# Patient Record
Sex: Male | Born: 1945 | Race: White | Hispanic: No | Marital: Married | Smoking: Never smoker
Health system: Southern US, Community
[De-identification: ages and names within clinical notes are randomized; demographics above are authoritative.]

## PROBLEM LIST (undated history)

## (undated) DIAGNOSIS — K219 Gastro-esophageal reflux disease without esophagitis: Secondary | ICD-10-CM

## (undated) DIAGNOSIS — Z972 Presence of dental prosthetic device (complete) (partial): Secondary | ICD-10-CM

## (undated) DIAGNOSIS — N529 Male erectile dysfunction, unspecified: Secondary | ICD-10-CM

## (undated) DIAGNOSIS — R399 Unspecified symptoms and signs involving the genitourinary system: Secondary | ICD-10-CM

## (undated) DIAGNOSIS — I719 Aortic aneurysm of unspecified site, without rupture: Secondary | ICD-10-CM

## (undated) DIAGNOSIS — C61 Malignant neoplasm of prostate: Secondary | ICD-10-CM

## (undated) DIAGNOSIS — Z87442 Personal history of urinary calculi: Secondary | ICD-10-CM

## (undated) DIAGNOSIS — Z973 Presence of spectacles and contact lenses: Secondary | ICD-10-CM

## (undated) DIAGNOSIS — I10 Essential (primary) hypertension: Secondary | ICD-10-CM

## (undated) HISTORY — PX: ANAL FISSURE REPAIR: SHX2312

## (undated) HISTORY — PX: APPENDECTOMY: SHX54

## (undated) HISTORY — PX: ABDOMINAL EXPLORATION SURGERY: SHX538

## (undated) HISTORY — PX: CARPAL TUNNEL RELEASE: SHX101

## (undated) HISTORY — PX: PROSTATE BIOPSY: SHX241

## (undated) HISTORY — PX: ABDOMINAL SURGERY: SHX537

---

## 1997-06-27 ENCOUNTER — Encounter: Admission: RE | Admit: 1997-06-27 | Discharge: 1997-09-25 | Payer: Self-pay | Admitting: Family Medicine

## 1997-12-31 ENCOUNTER — Encounter: Admission: RE | Admit: 1997-12-31 | Discharge: 1998-03-31 | Payer: Self-pay | Admitting: Family Medicine

## 1998-01-06 ENCOUNTER — Encounter: Admission: RE | Admit: 1998-01-06 | Discharge: 1998-01-20 | Payer: Self-pay | Admitting: Family Medicine

## 2000-05-15 ENCOUNTER — Other Ambulatory Visit: Admission: RE | Admit: 2000-05-15 | Discharge: 2000-05-15 | Payer: Self-pay | Admitting: Urology

## 2000-05-15 ENCOUNTER — Encounter (INDEPENDENT_AMBULATORY_CARE_PROVIDER_SITE_OTHER): Payer: Self-pay | Admitting: Specialist

## 2009-01-30 ENCOUNTER — Emergency Department (HOSPITAL_COMMUNITY): Admission: EM | Admit: 2009-01-30 | Discharge: 2009-01-30 | Payer: Self-pay | Admitting: Emergency Medicine

## 2009-04-30 ENCOUNTER — Encounter (INDEPENDENT_AMBULATORY_CARE_PROVIDER_SITE_OTHER): Payer: Self-pay | Admitting: Surgery

## 2009-04-30 ENCOUNTER — Inpatient Hospital Stay (HOSPITAL_COMMUNITY): Admission: EM | Admit: 2009-04-30 | Discharge: 2009-05-07 | Payer: Self-pay | Admitting: Emergency Medicine

## 2009-04-30 HISTORY — PX: LAPAROSCOPIC APPENDECTOMY: SUR753

## 2010-05-03 LAB — CBC
HCT: 36.8 % — ABNORMAL LOW (ref 39.0–52.0)
Hemoglobin: 12.3 g/dL — ABNORMAL LOW (ref 13.0–17.0)
Hemoglobin: 14.4 g/dL (ref 13.0–17.0)
MCHC: 33.3 g/dL (ref 30.0–36.0)
MCHC: 33.3 g/dL (ref 30.0–36.0)
MCV: 86.7 fL (ref 78.0–100.0)
MCV: 87 fL (ref 78.0–100.0)
Platelets: 182 10*3/uL (ref 150–400)
Platelets: 203 10*3/uL (ref 150–400)
RBC: 4.51 MIL/uL (ref 4.22–5.81)
RDW: 12.6 % (ref 11.5–15.5)
RDW: 12.8 % (ref 11.5–15.5)

## 2010-05-03 LAB — BASIC METABOLIC PANEL
CO2: 29 mEq/L (ref 19–32)
Calcium: 8.2 mg/dL — ABNORMAL LOW (ref 8.4–10.5)
Chloride: 97 mEq/L (ref 96–112)
Creatinine, Ser: 0.93 mg/dL (ref 0.4–1.5)
GFR calc Af Amer: >60 mL/min (ref >60)
GFR calc non Af Amer: >60 mL/min (ref >60)
GFR calc non Af Amer: >60 mL/min (ref >60)
GFR calc non Af Amer: >60 mL/min (ref >60)
Glucose, Bld: 181 mg/dL — ABNORMAL HIGH (ref 70–99)
Potassium: 2.9 mEq/L — ABNORMAL LOW (ref 3.5–5.1)
Potassium: 3.4 mEq/L — ABNORMAL LOW (ref 3.5–5.1)
Sodium: 136 mEq/L (ref 135–145)

## 2010-05-03 LAB — URINALYSIS, ROUTINE W REFLEX MICROSCOPIC
Bilirubin Urine: NEGATIVE
Glucose, UA: NEGATIVE mg/dL
Hgb urine dipstick: NEGATIVE
Specific Gravity, Urine: 1.014 (ref 1.005–1.030)
Urobilinogen, UA: 0.2 mg/dL (ref 0.0–1.0)
pH: 6 (ref 5.0–8.0)

## 2010-05-03 LAB — COMPREHENSIVE METABOLIC PANEL
CO2: 26 mEq/L (ref 19–32)
Chloride: 101 mEq/L (ref 96–112)
Creatinine, Ser: 1.08 mg/dL (ref 0.4–1.5)
GFR calc Af Amer: >60 mL/min (ref >60)

## 2010-05-03 LAB — DIFFERENTIAL
Lymphocytes Relative: 7 % — ABNORMAL LOW (ref 12–46)
Lymphs Abs: 0.9 10*3/uL (ref 0.7–4.0)
Monocytes Absolute: 0.8 10*3/uL (ref 0.1–1.0)
Monocytes Relative: 6 % (ref 3–12)
Neutro Abs: 11.6 10*3/uL — ABNORMAL HIGH (ref 1.7–7.7)
Neutrophils Relative %: 88 % — ABNORMAL HIGH (ref 43–77)

## 2010-05-03 LAB — MAGNESIUM: Magnesium: 2.1 mg/dL (ref 1.5–2.5)

## 2010-05-03 LAB — POTASSIUM: Potassium: 3.2 mEq/L — ABNORMAL LOW (ref 3.5–5.1)

## 2010-05-10 LAB — POCT URINALYSIS DIP (DEVICE)
Bilirubin Urine: NEGATIVE
Ketones, ur: NEGATIVE mg/dL
pH: 5.5 (ref 5.0–8.0)

## 2011-01-31 ENCOUNTER — Encounter: Payer: Self-pay | Admitting: *Deleted

## 2011-01-31 ENCOUNTER — Emergency Department (INDEPENDENT_AMBULATORY_CARE_PROVIDER_SITE_OTHER): Payer: 59

## 2011-01-31 ENCOUNTER — Emergency Department (HOSPITAL_COMMUNITY)
Admission: EM | Admit: 2011-01-31 | Discharge: 2011-01-31 | Disposition: A | Discharge status: Home / Self Care | Payer: 59 | Source: Home / Self Care | Attending: Emergency Medicine | Admitting: Emergency Medicine

## 2011-01-31 DIAGNOSIS — J111 Influenza due to unidentified influenza virus with other respiratory manifestations: Secondary | ICD-10-CM

## 2011-01-31 DIAGNOSIS — R6889 Other general symptoms and signs: Secondary | ICD-10-CM

## 2011-01-31 HISTORY — DX: Essential (primary) hypertension: I10

## 2011-01-31 MED ORDER — OSELTAMIVIR PHOSPHATE 12 MG/ML PO SUSR: 75.0000 mg | Freq: Two times a day (BID) | ORAL | Status: AC

## 2011-01-31 NOTE — ED Notes (Signed)
Pt  Has  Had  persistant  Cough  For  Several  Weeks  He  devloped  Fever  Body  Aches     Early  This  Am        Sitting    pright  On  Exam table  Appears  In no  Severe  Distress

## 2011-01-31 NOTE — ED Provider Notes (Addendum)
History     CSN: [id number]  Arrival date & time 01/31/11  9629   First MD Initiated Contact with Patient 01/31/11 1111      Chief Complaint  Patient presents with  . Cough    (Consider location/radiation/quality/duration/timing/severity/associated sxs/prior treatment) Patient is a 65 y.o. male presenting with cough. The history is provided by the patient.  Cough This is a new problem. The current episode started more than 1 week ago. The problem occurs constantly. The cough is non-productive. The maximum temperature recorded prior to his arrival was 101 to 101.9 F. Associated symptoms include chills, ear congestion and rhinorrhea. Pertinent negatives include no sweats, no myalgias, no shortness of breath and no wheezing. He has tried decongestants and cough syrup for the symptoms. The treatment provided mild relief. He is not a smoker. His past medical history does not include pneumonia.    Past Medical History  Diagnosis Date  . Hypertension     Past Surgical History  Procedure Date  . Appendectomy   . Abdominal surgery     History reviewed. No pertinent family history.  History  Substance Use Topics  . Smoking status: Current Everyday Smoker  . Smokeless tobacco: Not on file  . Alcohol Use: Yes      Review of Systems  Constitutional: Positive for chills.  HENT: Positive for rhinorrhea.   Respiratory: Positive for cough. Negative for shortness of breath and wheezing.   Musculoskeletal: Negative for myalgias.    Allergies  Review of patient's allergies indicates no known allergies.  Home Medications   Current Outpatient Rx  Name Route Sig Dispense Refill  . LISINOPRIL-HYDROCHLOROTHIAZIDE 20-12.5 MG PO TABS Oral Take 1 tablet by mouth daily.      Marland Kitchen METOPROLOL TARTRATE 100 MG PO TABS Oral Take 100 mg by mouth 2 (two) times daily.      . MULTIVITAMINS PO CAPS Oral Take 1 capsule by mouth daily.      . OSELTAMIVIR PHOSPHATE 12 MG/ML PO SUSR Oral Take 75 mg by  mouth 2 (two) times daily. X 5 days 25 mL 0    BP 110/63  Pulse 100  Temp(Src) 99.5 F (37.5 C) (Oral)  Resp 16  SpO2 100%  Physical Exam  Nursing note and vitals reviewed. Constitutional: He appears well-developed and well-nourished.  HENT:  Head: Normocephalic.  Eyes: Conjunctivae are normal.  Neck: Normal range of motion. No JVD present.  Cardiovascular: Normal rate.   Pulmonary/Chest: Effort normal. No respiratory distress. He has rales.  Abdominal: Soft. Bowel sounds are normal. He exhibits no shifting dullness.  Neurological: He is alert.  Skin: Skin is warm. No pallor.    ED Course  Procedures (including critical care time)  Labs Reviewed - No data to display Dg Chest 2 View  01/31/2011  *RADIOLOGY REPORT*  Clinical Data: Cough.  Fever.  CHEST - 2 VIEW  Comparison: None.  Findings: Low lung volumes are seen however both lungs are clear. No evidence of pleural effusion.  Mild cardiomegaly noted.  No mass or lymphadenopathy identified.  IMPRESSION: Mild cardiomegaly and low lung volumes.  No active lung disease.  Original Report Authenticated By: Danae Orleans, M.D.     1. Influenza-like symptoms       MDM  ILI x 3 days with cough for 2 weeks (prior)        Jimmie Molly, MD 01/31/11 1758  Jimmie Molly, MD 01/31/11 1759

## 2011-10-26 ENCOUNTER — Telehealth: Payer: Self-pay

## 2012-01-13 NOTE — Telephone Encounter (Signed)
9

## 2014-10-21 ENCOUNTER — Ambulatory Visit (INDEPENDENT_AMBULATORY_CARE_PROVIDER_SITE_OTHER): Payer: Worker's Compensation | Admitting: Internal Medicine

## 2014-10-21 VITALS — BP 136/76 | HR 61 | Temp 98.2°F | Resp 16 | Ht 71.0 in | Wt 227.0 lb

## 2014-10-21 DIAGNOSIS — S29019A Strain of muscle and tendon of unspecified wall of thorax, initial encounter: Secondary | ICD-10-CM

## 2014-10-21 DIAGNOSIS — S29012A Strain of muscle and tendon of back wall of thorax, initial encounter: Secondary | ICD-10-CM

## 2014-10-21 MED ORDER — MELOXICAM 15 MG PO TABS: 15.0000 mg | ORAL_TABLET | Freq: Every day | ORAL | Status: DC

## 2014-10-21 MED ORDER — CYCLOBENZAPRINE HCL 10 MG PO TABS: 10.0000 mg | ORAL_TABLET | Freq: Every day | ORAL | Status: DC

## 2014-10-21 NOTE — Progress Notes (Signed)
   Subjective:  This chart was scribed for Franklin Lin, MD by Moises Blood, Medical Scribe. This patient was seen in Room 10 and the patient's care was started at 2:10 PM.   Patient ID: Franklin Hall, male    DOB: 1945-05-08, 69 y.o.   MRN: [id number] Chief Complaint  Patient presents with  . Shoulder Pain    x 1 day , Worker's Comp     HPI Franklin Hall is a 69 y.o. male who presents to [facility] complaining of right shoulder injury while at work that occurred yesterday.  When he was taking a jackhammer off the truck, he had his hand underneath and before it fell on his hand, he pulled back in a jerking motion really quick. He felt some cramping pain in his right shoulder after.   No Known Allergies   Review of Systems  Constitutional: Negative for fever, chills and fatigue.  Gastrointestinal: Negative for nausea, vomiting, diarrhea and constipation.  Musculoskeletal: Positive for myalgias (shoulder pain).  Skin: Negative for rash and wound.  Neurological: Negative for dizziness, weakness, numbness and headaches.       Objective:   Physical Exam  Constitutional: He is oriented to person, place, and time. He appears well-developed and well-nourished. No distress.  HENT:  Head: Normocephalic and atraumatic.  Eyes: EOM are normal. Pupils are equal, round, and reactive to light.  Neck: Neck supple.  Cardiovascular: Normal rate.   Pulmonary/Chest: Effort normal. No respiratory distress.  Musculoskeletal: Normal range of motion.  Tender right posterior thoracic area over ribs 8,9,10 in posterior axillary line with pain on reaching and twisting and pulling; no lumbar or cervical tenderness and no pain with deep breathing, no rib defect, no ecchymosis or swelling   Neurological: He is alert and oriented to person, place, and time.  Skin: Skin is warm and dry.  Psychiatric: He has a normal mood and affect. His behavior is normal.  Nursing note and vitals reviewed.   BP 136/76  mmHg  Pulse 61  Temp(Src) 98.2 F (36.8 C) (Oral)  Resp 16  Ht 5\' 11"  (1.803 m)  Wt 227 lb (102.967 kg)  BMI 31.67 kg/m2  SpO2 98%       Assessment & Plan:   I have completed the patient encounter in its entirety as documented by the scribe, with editing by me where necessary. Robert P. Laney Pastor, M.D. Strain of thoracic paraspinal muscles excluding T1 and T2 levels, initial encounter  Meds ordered this encounter  Medications  . meloxicam (MOBIC) 15 MG tablet    Sig: Take 1 tablet (15 mg total) by mouth daily. For pain    Dispense:  14 tablet    Refill:  0    Worker's Compensation  . cyclobenzaprine (FLEXERIL) 10 MG tablet    Sig: Take 1 tablet (10 mg total) by mouth at bedtime.    Dispense:  10 tablet    Refill:  0    Worker's Compensation   15 pound weight limit for lifting Follow-up 10 days Range of motion exercises

## 2014-10-30 ENCOUNTER — Ambulatory Visit (INDEPENDENT_AMBULATORY_CARE_PROVIDER_SITE_OTHER): Payer: Worker's Compensation | Admitting: Family Medicine

## 2014-10-30 VITALS — BP 120/80 | HR 60 | Temp 98.6°F | Resp 16 | Ht 71.0 in | Wt 227.0 lb

## 2014-10-30 DIAGNOSIS — M5489 Other dorsalgia: Secondary | ICD-10-CM

## 2014-10-30 DIAGNOSIS — S29019D Strain of muscle and tendon of unspecified wall of thorax, subsequent encounter: Secondary | ICD-10-CM

## 2014-10-30 DIAGNOSIS — S29012D Strain of muscle and tendon of back wall of thorax, subsequent encounter: Secondary | ICD-10-CM

## 2014-10-30 MED ORDER — MELOXICAM 15 MG PO TABS: 15.0000 mg | ORAL_TABLET | Freq: Every day | ORAL | Status: DC | PRN

## 2014-10-30 MED ORDER — CYCLOBENZAPRINE HCL 10 MG PO TABS: 10.0000 mg | ORAL_TABLET | Freq: Every evening | ORAL | Status: DC | PRN

## 2014-10-30 NOTE — Patient Instructions (Signed)
Your symptoms should continue to to improve, especially as the cough improves. Okay to use the meloxicam once per day as needed, and Flexeril at night if needed for muscle spasm. As her symptoms improve, you can discontinue the muscle relaxant first followed by the meloxicam as tolerated. Continue to avoid heavy lifting and overhead reaching with work. Okay for the gentle range of motion as we discussed and stretches throughout the day. Return to the clinic or go to the nearest emergency room if any of your symptoms worsen or new symptoms occur.

## 2014-10-30 NOTE — Progress Notes (Signed)
Subjective:  This chart was scribed for Corliss Parish, MD by Leandra Kern, Medical Scribe. This patient was seen in Room 8 and the patient's care was started at 8:46 AM.   Patient ID: Franklin Hall, male    DOB: 05/09/1945, 69 y.o.   MRN: [id number]  Chief Complaint  Patient presents with  . Work Related Injury    follow up back pain    HPI HPI Comments: Franklin Hall is a 69 y.o. male who presents to [facility] for a follow up from an injury occurred at work on 10/20/14. Pain into the right shoulder after using a jerking motion pulling a jack hammer off a truck. Strain of the thoracic par spinal muscles of the upper back. He was treated with meloxicam and flexeril, as well as weight restrictions. Pt is here for a follow up. Pt reports that he takes meloxicam during the morning and flexeril during the night, he notes experiencing no side effects with the medications. Pt indicates that he feels about 60/70% improvement of the pain. He indicates however that experiencing some cold symptoms such as cough (that is still present) caused the healing process to be slower. He states that reaching over his head still cause him some pain.   Pt works as a Administrator.   There are no active problems to display for this patient.  Past Medical History  Diagnosis Date  . Hypertension    Past Surgical History  Procedure Laterality Date  . Appendectomy    . Abdominal surgery     No Known Allergies Prior to Admission medications   Medication Sig Start Date End Date Taking? Authorizing Dimitra Woodstock  cyclobenzaprine (FLEXERIL) 10 MG tablet Take 1 tablet (10 mg total) by mouth at bedtime. 10/21/14  Yes Leandrew Koyanagi, MD  lisinopril-hydrochlorothiazide (PRINZIDE,ZESTORETIC) 20-12.5 MG per tablet Take 1 tablet by mouth daily.     Yes Historical Takayla Baillie, MD  meloxicam (MOBIC) 15 MG tablet Take 1 tablet (15 mg total) by mouth daily. For pain 10/21/14  Yes Leandrew Koyanagi, MD    metoprolol (LOPRESSOR) 100 MG tablet Take 100 mg by mouth 2 (two) times daily.     Yes Historical Shannie Kontos, MD  Multiple Vitamin (MULTIVITAMIN) capsule Take 1 capsule by mouth daily.     Yes Historical Lucindia Lemley, MD   Social History   Social History  . Marital Status: Married    Spouse Name: N/A  . Number of Children: N/A  . Years of Education: N/A   Occupational History  . Not on file.   Social History Main Topics  . Smoking status: Never Smoker   . Smokeless tobacco: Never Used  . Alcohol Use: 0.0 oz/week    0 Standard drinks or equivalent per week  . Drug Use: Not on file  . Sexual Activity: Not on file   Other Topics Concern  . Not on file   Social History Narrative    Review of Systems  Respiratory: Positive for cough.   Musculoskeletal: Positive for myalgias and back pain.      Objective:   Physical Exam  Constitutional: He is oriented to person, place, and time. He appears well-developed and well-nourished. No distress.  HENT:  Head: Normocephalic and atraumatic.  Eyes: EOM are normal. Pupils are equal, round, and reactive to light.  Neck: Neck supple.  C-spine is non tender. Para spinal muscles non tender. Pain free range of motion on cervical spine.  Cardiovascular: Normal rate.  Pulmonary/Chest: Effort normal.  Musculoskeletal:  No mid line bony tenderness. Tenderness over the right Para spinal and rhomboid, no bony tenderness.  Neurological: He is alert and oriented to person, place, and time. No cranial nerve deficit.  Skin: Skin is warm and dry.  Psychiatric: He has a normal mood and affect. His behavior is normal.  Nursing note and vitals reviewed.  Filed Vitals:   10/30/14 0840  BP: 120/80  Pulse: 60  Temp: 98.6 F (37 C)  TempSrc: Oral  Resp: 16  Height: 5\' 11"  (1.803 m)  Weight: 227 lb (102.967 kg)  SpO2: 98%   ;   Assessment & Plan:     By signing my name below, I, Rawaa Al Rifaie, attest that this documentation has been prepared  under the direction and in the presence of Corliss Parish, MD.  Leandra Kern, Medical Scribe. 10/30/2014.  8:58 AM. Franklin Hall is a 69 y.o. male Pain in right paraspinal region  Strain of thoracic paraspinal muscles excluding T1 and T2 levels, subsequent encounter Right paraspinals strains of the thoracic spine after injury at work on September 12. 60% improved. Suspect some discomfort persisting with his cough with recent upper respiratory infection. The cough is now improving so suspect his other symptoms to start improving as well. In 10 you meloxicam as needed, Flexeril as needed, but can wean these as symptoms improve. Recommended range of motion and stretches, recheck with me in 5 days. Sooner if worse. See paperwork for work with restrictions.  No orders of the defined types were placed in this encounter.   Patient Instructions  Your symptoms should continue to to improve, especially as the cough improves. Okay to use the meloxicam once per day as needed, and Flexeril at night if needed for muscle spasm. As her symptoms improve, you can discontinue the muscle relaxant first followed by the meloxicam as tolerated. Continue to avoid heavy lifting and overhead reaching with work. Okay for the gentle range of motion as we discussed and stretches throughout the day. Return to the clinic or go to the nearest emergency room if any of your symptoms worsen or new symptoms occur.     I personally performed the services described in this documentation, which was scribed in my presence. The recorded information has been reviewed and considered, and addended by me as needed.

## 2014-11-04 ENCOUNTER — Ambulatory Visit (INDEPENDENT_AMBULATORY_CARE_PROVIDER_SITE_OTHER): Payer: Worker's Compensation | Admitting: Family Medicine

## 2014-11-04 VITALS — BP 118/76 | HR 58 | Temp 98.5°F | Resp 16 | Ht 71.0 in | Wt 221.0 lb

## 2014-11-04 DIAGNOSIS — S29019D Strain of muscle and tendon of unspecified wall of thorax, subsequent encounter: Secondary | ICD-10-CM

## 2014-11-04 DIAGNOSIS — S29012D Strain of muscle and tendon of back wall of thorax, subsequent encounter: Secondary | ICD-10-CM

## 2014-11-04 NOTE — Progress Notes (Signed)
Subjective:  This chart was scribed for Franklin Ray MD,  by Franklin Hall, at [facility].  This patient was seen in room 4 and the patient's care was started at 10:00 AM.    Patient ID: Franklin Hall, male    DOB: 11/25/1945, 69 y.o.   MRN: [id number] Chief Complaint  Patient presents with  . Follow-up    back pain, x 3 weeks Workers Comp     HPI HPI Comments: Franklin Hall is a 69 y.o. male who presents to the [facility] for a follow up.   Patient states that he still has some pain when he is twisting or pulling.  He states that he is about 70% back to normal and is still on restrictions at work.  He is still taking Flexeril (full pill) twice a day (one in the morning and one at night) and is no longer taking Meloxicam.  Patient has no other complaints or concerns today. ----  Patient is here for a follow up for an injury which occurred at work Sept 12th (initial date of injury) thought to have a strain of the thoracic spine muscles upper T-spine.  He was 60 percent improved on the 22nd.  He was continued on flexeril and meloxicam as needed.   Of note he was having some persistent discomfort last time due to cough with a URI that was improving.     There are no active problems to display for this patient.  Past Medical History  Diagnosis Date  . Hypertension    Past Surgical History  Procedure Laterality Date  . Appendectomy    . Abdominal surgery     No Known Allergies Prior to Admission medications   Medication Sig Start Date End Date Taking? Authorizing Provider  cyclobenzaprine (FLEXERIL) 10 MG tablet Take 1 tablet (10 mg total) by mouth at bedtime as needed for muscle spasms. 10/30/14  Yes Wendie Agreste, MD  lisinopril-hydrochlorothiazide (PRINZIDE,ZESTORETIC) 20-12.5 MG per tablet Take 1 tablet by mouth daily.     Yes Historical Provider, MD  meloxicam (MOBIC) 15 MG tablet Take 1 tablet (15 mg total) by mouth daily as  needed for pain. For pain 10/30/14  Yes Wendie Agreste, MD  Multiple Vitamin (MULTIVITAMIN) capsule Take 1 capsule by mouth daily.     Yes Historical Provider, MD  metoprolol (LOPRESSOR) 100 MG tablet Take 100 mg by mouth 2 (two) times daily.      Historical Provider, MD   Social History   Social History  . Marital Status: Married    Spouse Name: N/A  . Number of Children: N/A  . Years of Education: N/A   Occupational History  . Not on file.   Social History Main Topics  . Smoking status: Never Smoker   . Smokeless tobacco: Never Used  . Alcohol Use: 0.0 oz/week    0 Standard drinks or equivalent per week  . Drug Use: Not on file  . Sexual Activity: Not on file   Other Topics Concern  . Not on file   Social History Narrative       Review of Systems  Constitutional: Negative for fever and chills.  Gastrointestinal: Negative for nausea and vomiting.  Musculoskeletal: Positive for back pain.  Skin: Negative for rash.       Objective:   Physical Exam  Constitutional: He appears well-developed and well-nourished. No distress.  HENT:  Head: Normocephalic and atraumatic.  Eyes: Pupils are equal,  round, and reactive to light.  Pulmonary/Chest: No respiratory distress.  Musculoskeletal: Normal range of motion.  minimal tenderness on the lower right thoracic spine muscles.   no midline bony tenderness. full range of motion reproduces symptoms with rotation of right thoracic spine muscles.  full strenght in upper extremeiteis   Skin: Skin is warm and dry.  skin intact no echymosis or rash.  Psychiatric: He has a normal mood and affect. His behavior is normal.  Nursing note and vitals reviewed.   Filed Vitals:   11/04/14 0924  BP: 118/76  Pulse: 58  Temp: 98.5 F (36.9 C)  TempSrc: Oral  Resp: 16  Height: 5\' 11"  (1.803 m)  Weight: 221 lb (100.245 kg)  SpO2: 98%        Assessment & Plan:  Franklin Hall is a 69 y.o. male Strain of thoracic paraspinal  muscles excluding T1 and T2 levels, subsequent encounter  -Still improving, but do not think he is quite ready for lifting just yet.  -Change from Flexeril to meloxicam in the morning, Flexeril at night as needed, but continue to wean this medicine as symptoms improve.  -Continue range of motion, stretches throughout the day, restrictions for the next 1 week then recheck. Anticipate trial of full duty if he continues to improve at that time.  No orders of the defined types were placed in this encounter.   Patient Instructions  You can try changing the Flexeril to just at bedtime, and then try weaning to just a half pill at a time at bedtime. You can try switching to meloxicam in the morning, and you can decrease this to half a pill as well as your pain improves. Continue range of motion and stretches throughout the day as tolerated. I will continue restrictions for now, but if you are continuing to improve next week, we may be able to try without restrictions at that time. Follow-up in one week, sooner if worse.      I personally performed the services described in this documentation, which was scribed in my presence. The recorded information has been reviewed and considered, and addended by me as needed.

## 2014-11-04 NOTE — Patient Instructions (Signed)
You can try changing the Flexeril to just at bedtime, and then try weaning to just a half pill at a time at bedtime. You can try switching to meloxicam in the morning, and you can decrease this to half a pill as well as your pain improves. Continue range of motion and stretches throughout the day as tolerated. I will continue restrictions for now, but if you are continuing to improve next week, we may be able to try without restrictions at that time. Follow-up in one week, sooner if worse.

## 2014-11-11 ENCOUNTER — Ambulatory Visit (INDEPENDENT_AMBULATORY_CARE_PROVIDER_SITE_OTHER): Payer: Worker's Compensation | Admitting: Physician Assistant

## 2014-11-11 VITALS — BP 108/64 | HR 73 | Temp 98.7°F | Resp 18 | Ht 71.5 in | Wt 219.8 lb

## 2014-11-11 DIAGNOSIS — S29012D Strain of muscle and tendon of back wall of thorax, subsequent encounter: Secondary | ICD-10-CM

## 2014-11-11 DIAGNOSIS — S29019D Strain of muscle and tendon of unspecified wall of thorax, subsequent encounter: Secondary | ICD-10-CM

## 2014-11-11 NOTE — Progress Notes (Signed)
[facility] [address], [place] [address] [phone]  Date:  11/11/2014   Name:  Franklin Hall   DOB:  Oct 09, 1945   MRN:  [id number]  PCP:  No primary care provider on file.    Chief Complaint: Follow-up   History of Present Illness:  This is a 69 y.o. male who is presenting for a follow up from an injury that occurred on 10/20/14. Pain into the right shoulder after using a jerking motion pulling a jack hammer off a truck. Strain of the thoracic paraspinal muscles of the upper back. He was treated with meloxicam and flexeril, as well as weight restrictions. At 10 day recheck he had 60% improvement of pain. At 15 day recheck he had 70% improvement of pain. Today (20 day recheck) he is reporting 80% improvement. He feels he is ready to go back to work. He states the pain is only exacerbated by twisting movement to the right and if he doesn't twist he does not have pain. He is still taking mobic QAM and flexeril QHS and feels this is helpful.   Review of Systems:  Review of Systems See HPI  Prior to Admission medications   Medication Sig Start Date End Date Taking? Authorizing Provider  cyclobenzaprine (FLEXERIL) 10 MG tablet Take 1 tablet (10 mg total) by mouth at bedtime as needed for muscle spasms. 10/30/14  Yes Wendie Agreste, MD  lisinopril-hydrochlorothiazide (PRINZIDE,ZESTORETIC) 20-12.5 MG per tablet Take 1 tablet by mouth daily.     Yes Historical Provider, MD  meloxicam (MOBIC) 15 MG tablet Take 1 tablet (15 mg total) by mouth daily as needed for pain. For pain 10/30/14  Yes Wendie Agreste, MD  metoprolol (LOPRESSOR) 100 MG tablet Take 100 mg by mouth 2 (two) times daily.     Yes Historical Provider, MD  Multiple Vitamin (MULTIVITAMIN) capsule Take 1 capsule by mouth daily.     Yes Historical Provider, MD    No Known Allergies   Medication list has been reviewed and updated.  Physical Examination:  Physical Exam  Constitutional: He is  oriented to person, place, and time. He appears well-developed and well-nourished. No distress.  HENT:  Head: Normocephalic and atraumatic.  Right Ear: Hearing normal.  Left Ear: Hearing normal.  Nose: Nose normal.  Eyes: Conjunctivae and lids are normal. Right eye exhibits no discharge. Left eye exhibits no discharge. No scleral icterus.  Pulmonary/Chest: Effort normal. No respiratory distress.  Musculoskeletal: Normal range of motion.  Mild pain with palpation over right thoracic paraspinal region. Pain reproduced with twisting trunk to right.  Neurological: He is alert and oriented to person, place, and time.  Skin: Skin is warm, dry and intact. No lesion and no rash noted.  Psychiatric: He has a normal mood and affect. His speech is normal and behavior is normal. Thought content normal.   BP 108/64 mmHg  Pulse 73  Temp(Src) 98.7 F (37.1 C) (Oral)  Resp 18  Ht 5' 11.5" (1.816 m)  Wt 219 lb 12.8 oz (99.701 kg)  BMI 30.23 kg/m2  SpO2 98%  Assessment and Plan:  1. Strain of thoracic paraspinal muscles excluding T1 and T2 levels, subsequent encounter Pt feels he is ready to return to work as his pain has improved 80%. He feels he can avoid exacerbating the pain if he practices proper lifting techniques and avoids twisting his trunk while lifting. He will continue current meds until finished. He will return to work full duty  and return at any time if his pain worsens.   Benjaman Pott Drenda Freeze, MHS [facility]  11/11/2014

## 2016-01-14 ENCOUNTER — Ambulatory Visit (INDEPENDENT_AMBULATORY_CARE_PROVIDER_SITE_OTHER): Payer: Medicare Other | Admitting: Podiatry

## 2016-01-14 ENCOUNTER — Ambulatory Visit (INDEPENDENT_AMBULATORY_CARE_PROVIDER_SITE_OTHER): Payer: Medicare Other

## 2016-01-14 DIAGNOSIS — M79671 Pain in right foot: Secondary | ICD-10-CM | POA: Diagnosis not present

## 2016-01-14 DIAGNOSIS — M722 Plantar fascial fibromatosis: Secondary | ICD-10-CM | POA: Diagnosis not present

## 2016-01-14 MED ORDER — TRIAMCINOLONE ACETONIDE 10 MG/ML IJ SUSP
10.0000 mg | Freq: Once | INTRAMUSCULAR | Status: AC
  Administered 2016-01-14: 10 mg

## 2016-01-14 NOTE — Patient Instructions (Signed)

## 2016-01-17 NOTE — Progress Notes (Signed)
Subjective:     Patient ID: Franklin Hall, male   DOB: 12/18/1945, 70 y.o.   MRN: [id number]  HPI patient presents stating I been having a lot of pain in my right heel that's been present for several months and I've tried changing shoe gear and over-the-counter insoles   Review of Systems  All other systems reviewed and are negative.      Objective:   Physical Exam  Constitutional: He is oriented to person, place, and time.  Cardiovascular: Intact distal pulses.   Musculoskeletal: Normal range of motion.  Neurological: He is oriented to person, place, and time.  Skin: Skin is warm.  Nursing note and vitals reviewed.  Neurovascular status intact muscle strength adequate range of motion within normal limits with exquisite discomfort plantar aspect right heel at the insertional point of the tendon into the calcaneus with inflammation fluid around the medial band. There is moderate depression of the arch noted and good digital perfusion and well oriented 3     Assessment:     Inflammatory fasciitis right heel at the insertional point tendon calcaneus    Plan:     H&P x-rays reviewed and injected the plantar fascial right 3 mg Kenalog 5 mg Xylocaine and applied fascial brace and gave instructions on physical therapy supportive shoes and not going barefoot  X-ray report indicate spur formation with no signs of stress fracture or advanced arthritis

## 2016-01-20 ENCOUNTER — Encounter: Payer: Self-pay | Admitting: Podiatry

## 2016-01-20 ENCOUNTER — Ambulatory Visit (INDEPENDENT_AMBULATORY_CARE_PROVIDER_SITE_OTHER): Payer: Medicare Other | Admitting: Podiatry

## 2016-01-20 DIAGNOSIS — M722 Plantar fascial fibromatosis: Secondary | ICD-10-CM

## 2016-01-21 NOTE — Progress Notes (Signed)
Subjective:     Patient ID: Franklin Hall, male   DOB: 05-22-1945, 70 y.o.   MRN: [id number]  HPI patient states she's doing a lot better with minimal discomfort and able to walk without significant deformity   Review of Systems     Objective:   Physical Exam Neurovascular status intact muscle strength adequate with patient found to have diminished discomfort plantar aspect heel with pain still present medial band but localized in nature with no significant shooting pain    Assessment:     Significant reduction of discomfort plantar fascial    Plan:     Advised on physical therapy anti-inflammatories and utilization of shoe gear modification and not going barefoot. Spent a great of time educating her on this condition and the chronic nature of

## 2016-04-01 ENCOUNTER — Ambulatory Visit: Payer: Medicare Other | Admitting: Podiatry

## 2016-04-13 ENCOUNTER — Ambulatory Visit (INDEPENDENT_AMBULATORY_CARE_PROVIDER_SITE_OTHER): Payer: Medicare Other | Admitting: Podiatry

## 2016-04-13 ENCOUNTER — Encounter: Payer: Self-pay | Admitting: Podiatry

## 2016-04-13 DIAGNOSIS — M722 Plantar fascial fibromatosis: Secondary | ICD-10-CM | POA: Diagnosis not present

## 2016-04-13 MED ORDER — TRIAMCINOLONE ACETONIDE 10 MG/ML IJ SUSP
10.0000 mg | Freq: Once | INTRAMUSCULAR | Status: AC
  Administered 2016-04-13: 10 mg

## 2016-04-16 NOTE — Progress Notes (Signed)
Subjective:     Patient ID: Franklin Hall, male   DOB: [birth year], 71 y.o.   MRN: [id number]  HPI patient states that the heel pain has returned and it's worse when getting up in the morning or after periods of sitting and it's difficult to be ambulatory   Review of Systems     Objective:   Physical Exam Neurovascular status intact negative Homans sign was noted with patient found to have discomfort in the plantar fascial with fluid buildup around the medial band and depression of the arch    Assessment:     Return of plantar fasciitis right with inflammation fluid buildup    Plan:     H&P condition reviewed and reinjected the plantar fascia 3 mg Kenalog 5 mg Xylocaine and dispensed night splint with all instructions on usage

## 2016-06-20 ENCOUNTER — Telehealth: Payer: Self-pay | Admitting: *Deleted

## 2016-06-20 NOTE — Telephone Encounter (Addendum)
Pt's wife, Anderson Malta called for other option for treating pt's pain. I was unable to leave a message [phone], invalid number. Left message on [name]'s home phone to call again for more information. 06/21/2016-Pt's wife, Anderson Malta called again. I left message informing pt of EPAT - 5-6 monthly treatments of [place] shockwave that irritated the plantar fascia to stimulate the healing WBC to come to the area and begin healing process againm $100.00/session, and Endoscopic Plantar Fasciotomy - surgery performed at surgery center, Dr. Paulla Dolly uses an endoscopy scope to visualize the plantar fascia and cut the fascia to release the tightness and decrease pain, pt would be in a walking boot about 4-6 weeks and then transfer to surgical shoe and later athletic shoe as Dr. Paulla Dolly evaluates pt progress. I told Anderson Malta I left the message so she could think over during her busy schedule and call with concerns. I reiterated the 2 procedures and also the option for another injection and possible orthotics.

## 2016-06-21 NOTE — Telephone Encounter (Signed)
Patient's wife is returning your call about the message you left about the two procedures. Wants to know if we know or can get an idea of what his insurance would pay towards one or the other. Stated he wants a fix, not a quick fix but a permanent fix if that makes sense. Asked if you could call her back when you got a chance.

## 2017-05-12 ENCOUNTER — Ambulatory Visit: Payer: Self-pay | Admitting: General Surgery

## 2019-07-25 DIAGNOSIS — Z024 Encounter for examination for driving license: Secondary | ICD-10-CM | POA: Insufficient documentation

## 2019-08-21 DIAGNOSIS — S43409A Unspecified sprain of unspecified shoulder joint, initial encounter: Secondary | ICD-10-CM | POA: Insufficient documentation

## 2019-08-30 ENCOUNTER — Other Ambulatory Visit: Payer: Self-pay

## 2019-08-30 ENCOUNTER — Ambulatory Visit: Payer: Medicare Other | Admitting: Orthotics

## 2019-08-30 ENCOUNTER — Encounter: Payer: Self-pay | Admitting: Podiatry

## 2019-08-30 ENCOUNTER — Ambulatory Visit: Payer: Medicare Other | Admitting: Podiatry

## 2019-08-30 DIAGNOSIS — G5762 Lesion of plantar nerve, left lower limb: Secondary | ICD-10-CM

## 2019-08-30 DIAGNOSIS — M6788 Other specified disorders of synovium and tendon, other site: Secondary | ICD-10-CM

## 2019-08-30 DIAGNOSIS — G5761 Lesion of plantar nerve, right lower limb: Secondary | ICD-10-CM | POA: Diagnosis not present

## 2019-08-30 NOTE — Progress Notes (Signed)
Due to financial considerations patient wants to hold off

## 2019-08-30 NOTE — Progress Notes (Signed)
Subjective:  Patient ID: Franklin Hall, male    DOB: 02/15/1945,  MRN: [id number]  Chief Complaint  Patient presents with  . Foot Pain    Pt state he is having a flare up- but now its more towards the upper bottom foot near toe area- Pt states maybe an insole may help- wants further evaulation     74 y.o. male presents with the above complaint.  Patient presents with complaint of bilateral first interspace pain.  Patient states that is been happening when walking on it.  Patient is a feels that walking on a pebble a little bit.  Patient has tried various different insoles but has not helped.  He would also like to discuss a little bit about orthotics as well.  He denies any other acute complaints.  He wants to get it evaluated.  His plantar fasciitis that was treated couple years ago is doing pretty well.   Review of Systems: Negative except as noted in the HPI. Denies N/V/F/Ch.  Past Medical History:  Diagnosis Date  . Hypertension     Current Outpatient Medications:  .  cyclobenzaprine (FLEXERIL) 10 MG tablet, Take 1 tablet (10 mg total) by mouth at bedtime as needed for muscle spasms., Disp: 10 tablet, Rfl: 0 .  lisinopril-hydrochlorothiazide (PRINZIDE,ZESTORETIC) 20-12.5 MG per tablet, Take 1 tablet by mouth daily.  , Disp: , Rfl:  .  meloxicam (MOBIC) 15 MG tablet, Take 1 tablet (15 mg total) by mouth daily as needed for pain. For pain, Disp: 10 tablet, Rfl: 0 .  metoprolol (LOPRESSOR) 100 MG tablet, Take 100 mg by mouth 2 (two) times daily.  , Disp: , Rfl:  .  Multiple Vitamin (MULTIVITAMIN) capsule, Take 1 capsule by mouth daily.  , Disp: , Rfl:   Social History   Tobacco Use  Smoking Status Never Smoker  Smokeless Tobacco Never Used    No Known Allergies Objective:  There were no vitals filed for this visit. There is no height or weight on file to calculate BMI. Constitutional Well developed. Well nourished.  Vascular Dorsalis pedis pulses palpable  bilaterally. Posterior tibial pulses palpable bilaterally. Capillary refill normal to all digits.  No cyanosis or clubbing noted. Pedal hair growth normal.  Neurologic Normal speech. Oriented to person, place, and time. Epicritic sensation to light touch grossly present bilaterally.  Dermatologic Nails well groomed and normal in appearance. No open wounds. No skin lesions.  Orthopedic:  Pain on palpation to the first interspace bilaterally with right little bit greater than left.  Positive Mulder's click noted.  Pain with palpation of the interspace with lateral squeeze of the toe.  No acute metatarsophalangeal joint pain at first and second noted.  No sesamoiditis noted.  No extensor or flexor tendinitis noted.   Radiographs: None Assessment:   1. Morton's neuroma of right foot   2. Morton's neuroma of left foot    Plan:  Patient was evaluated and treated and all questions answered.  Bilateral first interspace neuroma right greater than left -I explained to patient the etiology of neuroma and various treatment options were extensively discussed.  I believe patient will benefit from diagnostic block to rule in neuroma.  Clinically patient has high suspicion for first interspace Morton's neuroma.  I discussed this with the patient patient agrees with the plan would like to undergo diagnostic block. If positive for neuroma we will plan on treatment options other surgical excision versus alcohol injection with 4%.  Patient agrees with the plan -After obtaining  consent, and per orders of Dr. Boneta Lucks, injection of one-to-one mixture of 1% lidocaine plain half percent Marcaine plain 3 cc given by Felipa Furnace. Patient instructed to remain in clinic for 20 minutes afterwards, and to report any adverse reaction to me immediately.   No follow-ups on file.

## 2019-09-06 ENCOUNTER — Other Ambulatory Visit: Payer: Self-pay

## 2019-09-06 ENCOUNTER — Ambulatory Visit (INDEPENDENT_AMBULATORY_CARE_PROVIDER_SITE_OTHER): Payer: Medicare Other | Admitting: Podiatry

## 2019-09-06 DIAGNOSIS — G5762 Lesion of plantar nerve, left lower limb: Secondary | ICD-10-CM | POA: Diagnosis not present

## 2019-09-06 DIAGNOSIS — G5761 Lesion of plantar nerve, right lower limb: Secondary | ICD-10-CM

## 2019-09-10 ENCOUNTER — Encounter: Payer: Self-pay | Admitting: Podiatry

## 2019-09-10 NOTE — Progress Notes (Signed)
  Subjective:  Patient ID: Franklin Hall, male    DOB: 06-12-1945,  MRN: [id number]  Chief Complaint  Patient presents with  . Foot Pain    pt is here for a 1 week f/u injection to the right foot.    74 y.o. male presents with the above complaint.  Patient presents with a complaint of bilateral first interspace neuroma follow-up.  Patient states he is doing a lot better.  The injection helped considerably.  He does not have any further pain.  He has decided that he will do a surgical excision if it comes back again.  He states the foot padding has helped considerably.  He denies any other acute complaints.   Review of Systems: Negative except as noted in the HPI. Denies N/V/F/Ch.  Past Medical History:  Diagnosis Date  . Hypertension     Current Outpatient Medications:  .  cyclobenzaprine (FLEXERIL) 10 MG tablet, Take 1 tablet (10 mg total) by mouth at bedtime as needed for muscle spasms., Disp: 10 tablet, Rfl: 0 .  IBU 800 MG tablet, Take 800 mg by mouth 3 (three) times daily as needed., Disp: , Rfl:  .  lisinopril-hydrochlorothiazide (PRINZIDE,ZESTORETIC) 20-12.5 MG per tablet, Take 1 tablet by mouth daily.  , Disp: , Rfl:  .  meloxicam (MOBIC) 15 MG tablet, Take 1 tablet (15 mg total) by mouth daily as needed for pain. For pain, Disp: 10 tablet, Rfl: 0 .  metoprolol (LOPRESSOR) 100 MG tablet, Take 100 mg by mouth 2 (two) times daily.  , Disp: , Rfl:  .  Multiple Vitamin (MULTIVITAMIN) capsule, Take 1 capsule by mouth daily.  , Disp: , Rfl:   Social History   Tobacco Use  Smoking Status Never Smoker  Smokeless Tobacco Never Used    No Known Allergies Objective:  There were no vitals filed for this visit. There is no height or weight on file to calculate BMI. Constitutional Well developed. Well nourished.  Vascular Dorsalis pedis pulses palpable bilaterally. Posterior tibial pulses palpable bilaterally. Capillary refill normal to all digits.  No cyanosis or clubbing  noted. Pedal hair growth normal.  Neurologic Normal speech. Oriented to person, place, and time. Epicritic sensation to light touch grossly present bilaterally.  Dermatologic Nails well groomed and normal in appearance. No open wounds. No skin lesions.  Orthopedic:  Mild pain on palpation to the first interspace bilaterally with right little bit greater than left.  Positive Mulder's click noted.  Mild pain with palpation of the interspace with lateral squeeze of the toe.  No acute metatarsophalangeal joint pain at first and second noted.  No sesamoiditis noted.  No extensor or flexor tendinitis noted.   Radiographs: None Assessment:   1. Morton's neuroma of right foot   2. Morton's neuroma of left foot    Plan:  Patient was evaluated and treated and all questions answered.  Bilateral first interspace neuroma right greater than left -Patient states a diagnostic block is completely decreased in neuroma pain.  He states he has been able to ambulate without any restrictions.  At this point patient would like to discuss treatment options given that the diagnostic block helped considerably.  I discussed with the patient both surgical as well as continuous injection from 4% alcohol.  Patient states for now he would like to hold off on any treatment modalities however if this comes back or recurs patient is planning on doing surgical excision.   No follow-ups on file.

## 2019-11-29 HISTORY — PX: ARTHOSCOPIC ROTAOR CUFF REPAIR: SHX5002

## 2020-03-12 DIAGNOSIS — R3912 Poor urinary stream: Secondary | ICD-10-CM | POA: Diagnosis not present

## 2020-03-12 DIAGNOSIS — R350 Frequency of micturition: Secondary | ICD-10-CM | POA: Diagnosis not present

## 2020-03-13 ENCOUNTER — Other Ambulatory Visit (HOSPITAL_COMMUNITY): Payer: Self-pay | Admitting: Urology

## 2020-03-13 ENCOUNTER — Other Ambulatory Visit: Payer: Self-pay | Admitting: Urology

## 2020-03-13 DIAGNOSIS — C61 Malignant neoplasm of prostate: Secondary | ICD-10-CM

## 2020-03-25 ENCOUNTER — Encounter (HOSPITAL_COMMUNITY)
Admission: RE | Admit: 2020-03-25 | Discharge: 2020-03-25 | Disposition: A | Discharge status: Home / Self Care | Payer: Medicare Other | Source: Ambulatory Visit | Attending: Urology | Admitting: Urology

## 2020-03-25 ENCOUNTER — Other Ambulatory Visit: Payer: Self-pay

## 2020-03-25 DIAGNOSIS — M170 Bilateral primary osteoarthritis of knee: Secondary | ICD-10-CM | POA: Diagnosis not present

## 2020-03-25 DIAGNOSIS — M19012 Primary osteoarthritis, left shoulder: Secondary | ICD-10-CM | POA: Diagnosis not present

## 2020-03-25 DIAGNOSIS — C61 Malignant neoplasm of prostate: Secondary | ICD-10-CM

## 2020-03-25 DIAGNOSIS — I700 Atherosclerosis of aorta: Secondary | ICD-10-CM | POA: Diagnosis not present

## 2020-03-25 DIAGNOSIS — K439 Ventral hernia without obstruction or gangrene: Secondary | ICD-10-CM | POA: Diagnosis not present

## 2020-03-25 DIAGNOSIS — K469 Unspecified abdominal hernia without obstruction or gangrene: Secondary | ICD-10-CM | POA: Diagnosis not present

## 2020-03-25 DIAGNOSIS — M19011 Primary osteoarthritis, right shoulder: Secondary | ICD-10-CM | POA: Diagnosis not present

## 2020-03-25 MED ORDER — TECHNETIUM TC 99M MEDRONATE IV KIT
19.7000 | PACK | Freq: Once | INTRAVENOUS | Status: AC | PRN
  Administered 2020-03-25: 19.7 via INTRAVENOUS

## 2020-04-06 ENCOUNTER — Other Ambulatory Visit: Payer: Self-pay | Admitting: Urology

## 2020-04-06 ENCOUNTER — Other Ambulatory Visit (HOSPITAL_COMMUNITY): Payer: Self-pay | Admitting: Urology

## 2020-04-06 DIAGNOSIS — C61 Malignant neoplasm of prostate: Secondary | ICD-10-CM

## 2020-04-09 ENCOUNTER — Other Ambulatory Visit (HOSPITAL_COMMUNITY): Payer: Self-pay | Admitting: Urology

## 2020-04-09 DIAGNOSIS — C61 Malignant neoplasm of prostate: Secondary | ICD-10-CM

## 2020-04-09 DIAGNOSIS — M899 Disorder of bone, unspecified: Secondary | ICD-10-CM

## 2020-04-13 ENCOUNTER — Ambulatory Visit (HOSPITAL_COMMUNITY): Payer: Medicare Other

## 2020-04-13 ENCOUNTER — Encounter: Payer: Self-pay | Admitting: Radiation Oncology

## 2020-04-13 NOTE — Progress Notes (Signed)
GU Location of Tumor / Histology: prostatic adenocarcinoma  If Prostate Cancer, Gleason Score is (4 + 4) and PSA is (9.77). Prostate volume: 26.64 g.   Franklin Hall presented to his PCP in July or August with a weak urine stream. PSA was collected by PCP and proved to be elevated. Patient's brother had a prostatectomy.  Biopsies of prostate (if applicable) revealed:    Past/Anticipated interventions by urology, if any: prescribed finasteride, prescribed tamsulosin,prostate biopsy, CT abd/pelvis, bone scan (mediasternum), MRI chest ordered, referral to Dr. Tammi Klippel to discuss radiation options  Past/Anticipated interventions by medical oncology, if any: no  Weight changes, if any: no  Bowel/Bladder complaints, if any: IPSS 3. SHIM 20. Denies dysuria, hematuria, urinary leakage or incontinence.Reports nocturia x 2. Denies urinary frequency or urgency. Reports a good FOS without the sensation of not emptying his bladder. Denies any bowel complaints.  Nausea/Vomiting, if any: no  Pain issues, if any:  Reports occasional shoulder pain related to effects of surgery.  SAFETY ISSUES:  Prior radiation? denies  Pacemaker/ICD? denies  Possible current pregnancy? no, male patient  Is the patient on methotrexate? no  Current Complaints / other details:  75 year old male. Resides in [place]. Married.

## 2020-04-14 ENCOUNTER — Ambulatory Visit
Admission: RE | Admit: 2020-04-14 | Discharge: 2020-04-14 | Disposition: A | Discharge status: Home / Self Care | Payer: Medicare Other | Source: Ambulatory Visit | Attending: Radiation Oncology | Admitting: Radiation Oncology

## 2020-04-14 ENCOUNTER — Ambulatory Visit (HOSPITAL_COMMUNITY)
Admission: RE | Admit: 2020-04-14 | Discharge: 2020-04-14 | Disposition: A | Discharge status: Home / Self Care | Payer: Medicare Other | Source: Ambulatory Visit | Attending: Urology | Admitting: Urology

## 2020-04-14 ENCOUNTER — Encounter: Payer: Self-pay | Admitting: Radiation Oncology

## 2020-04-14 ENCOUNTER — Other Ambulatory Visit: Payer: Self-pay

## 2020-04-14 VITALS — BP 153/88 | HR 66 | Temp 97.6°F | Resp 18 | Ht 69.0 in | Wt 224.0 lb

## 2020-04-14 DIAGNOSIS — M899 Disorder of bone, unspecified: Secondary | ICD-10-CM

## 2020-04-14 DIAGNOSIS — C61 Malignant neoplasm of prostate: Secondary | ICD-10-CM

## 2020-04-14 DIAGNOSIS — I712 Thoracic aortic aneurysm, without rupture: Secondary | ICD-10-CM | POA: Diagnosis not present

## 2020-04-14 DIAGNOSIS — M1288 Other specific arthropathies, not elsewhere classified, other specified site: Secondary | ICD-10-CM | POA: Diagnosis not present

## 2020-04-14 DIAGNOSIS — M19011 Primary osteoarthritis, right shoulder: Secondary | ICD-10-CM | POA: Diagnosis not present

## 2020-04-14 HISTORY — DX: Malignant neoplasm of prostate: C61

## 2020-04-14 MED ORDER — GADOBUTROL 1 MMOL/ML IV SOLN
10.0000 mL | Freq: Once | INTRAVENOUS | Status: AC | PRN
  Administered 2020-04-14: 10 mL via INTRAVENOUS

## 2020-04-14 NOTE — Progress Notes (Signed)
Radiation Oncology         ([phone]) [phone] ________________________________  Initial Outpatient Consultation  Name: Franklin Hall MRN: [id number]  Date: 04/14/2020  DOB: 1945/04/23  QM:VHQIO, Molly Maduro, MD  Jannifer Hick, MD   REFERRING PHYSICIAN: Jannifer Hick, MD  DIAGNOSIS: 75 y.o. gentleman with Stage T1c adenocarcinoma of the prostate with Gleason score of 4+4, and PSA of 9.77.    ICD-10-CM   1. Malignant neoplasm of prostate (HCC)  C61     HISTORY OF PRESENT ILLNESS: Franklin Hall is a 75 y.o. male with a diagnosis of prostate cancer. He was noted to have an elevated PSA of 11.72 on routine labs 09/09/19 by his primary care physician, Dr. Lenise Arena, during work up for a weak urine stream.  He was started on finasteride around that time and a repeat PSA remained elevated at 9.4 on 10/09/2019. Accordingly, he was referred for evaluation in urology by Dr. Cardell Peach on 01/29/2020,  digital rectal examination was performed at that time revealing no nodules. Repeat PSA that same day remained stable elevated at 9.77.  The patient proceeded to transrectal ultrasound with 12 biopsies of the prostate on 03/05/2020.  The prostate volume measured 24.64 cc.  Out of 12 core biopsies, 4 were positive.  The maximum Gleason score was 4+4, and this was seen in the right base and right base lateral, both with PNI. Additionally, Gleason 3+4 was seen in the right mid (small focus) and left apex.  He underwent bone scan on 03/25/2020 showing a focus of indeterminate uptake noted near the sternomanubrial joint. There was also uptake in the ischial regions of pelvis bilaterally, likely due to enthesophytes noted on remote CT scan. He underwent chest MRI earlier today to further evaluate the sternomanubrial abnormality, and results are pending.  The patient reviewed the biopsy results with his urologist and he has kindly been referred today for discussion of potential radiation treatment options.   PREVIOUS RADIATION THERAPY:  No  PAST MEDICAL HISTORY:  Past Medical History:  Diagnosis Date  . Hypertension   . Prostate cancer (HCC)       PAST SURGICAL HISTORY: Past Surgical History:  Procedure Laterality Date  . ABDOMINAL SURGERY    . APPENDECTOMY    . PROSTATE BIOPSY      FAMILY HISTORY:  Family History  Problem Relation Age of Onset  . Cancer Father   . Prostate cancer Brother   . Breast cancer Neg Hx   . Colon cancer Neg Hx   . Pancreatic cancer Neg Hx     SOCIAL HISTORY:  Social History   Socioeconomic History  . Marital status: Married    Spouse name: Not on file  . Number of children: Not on file  . Years of education: Not on file  . Highest education level: Not on file  Occupational History  . Not on file  Tobacco Use  . Smoking status: Never Smoker  . Smokeless tobacco: Never Used  Vaping Use  . Vaping Use: Never used  Substance and Sexual Activity  . Alcohol use: Yes    Alcohol/week: 0.0 standard drinks  . Drug use: Never  . Sexual activity: Yes  Other Topics Concern  . Not on file  Social History Narrative  . Not on file   Social Determinants of Health   Financial Resource Strain: Not on file  Food Insecurity: Not on file  Transportation Needs: Not on file  Physical Activity: Not on file  Stress: Not on file  Social Connections: Not on file  Intimate Partner Violence: Not on file    ALLERGIES: Patient has no known allergies.  MEDICATIONS:  Current Outpatient Medications  Medication Sig Dispense Refill  . cetirizine (ZYRTEC) 10 MG chewable tablet Chew 10 mg by mouth daily.    Marland Kitchen lisinopril-hydrochlorothiazide (PRINZIDE,ZESTORETIC) 20-12.5 MG per tablet Take 1 tablet by mouth daily.    . metoprolol (LOPRESSOR) 100 MG tablet Take 100 mg by mouth 2 (two) times daily.    . Multiple Vitamin (MULTIVITAMIN) capsule Take 1 capsule by mouth daily.    Marland Kitchen omeprazole (PRILOSEC) 20 MG capsule Take 20 mg by mouth daily.     No current facility-administered medications for  this encounter.    REVIEW OF SYSTEMS:  On review of systems, the patient reports that he is doing well overall. He denies any chest pain, shortness of breath, cough, fevers, chills, night sweats, unintended weight changes. He denies any bowel disturbances, and denies abdominal pain, nausea or vomiting. He denies any new musculoskeletal or joint aches or pains. His IPSS was 3, indicating mild urinary symptoms. He reports nocturia x2. His SHIM was 20, indicating he has mild erectile dysfunction. A complete review of systems is obtained and is otherwise negative.    PHYSICAL EXAM:  Wt Readings from Last 3 Encounters:  04/14/20 224 lb (101.6 kg)  11/11/14 219 lb 12.8 oz (99.7 kg)  11/04/14 221 lb (100.2 kg)   Temp Readings from Last 3 Encounters:  04/14/20 97.6 F (36.4 C)  11/11/14 98.7 F (37.1 C) (Oral)  11/04/14 98.5 F (36.9 C) (Oral)   BP Readings from Last 3 Encounters:  04/14/20 (!) 153/88  11/11/14 108/64  11/04/14 118/76   Pulse Readings from Last 3 Encounters:  04/14/20 66  11/11/14 73  11/04/14 (!) 58   Pain Assessment Pain Score: 0-No pain/10  In general this is a well appearing Caucasian male in no acute distress. He's alert and oriented x4 and appropriate throughout the examination. Cardiopulmonary assessment is negative for acute distress and he exhibits normal effort.    KPS = 100  100 - Normal; no complaints; no evidence of disease. 90   - Able to carry on normal activity; minor signs or symptoms of disease. 80   - Normal activity with effort; some signs or symptoms of disease. 5   - Cares for self; unable to carry on normal activity or to do active work. 60   - Requires occasional assistance, but is able to care for most of his personal needs. 50   - Requires considerable assistance and frequent medical care. 40   - Disabled; requires special care and assistance. 30   - Severely disabled; hospital admission is indicated although death not imminent. 20   -  Very sick; hospital admission necessary; active supportive treatment necessary. 10   - Moribund; fatal processes progressing rapidly. 0     - Dead  Karnofsky DA, Abelmann WH, Craver LS and Burchenal [facility] [phone]) The use of the nitrogen mustards in the palliative treatment of carcinoma: with particular reference to bronchogenic carcinoma Cancer 1 634-56  LABORATORY DATA:  Lab Results  Component Value Date   WBC 7.5 05/03/2009   HGB 12.3 (L) 05/03/2009   HCT 36.8 (L) 05/03/2009   MCV 86.7 05/03/2009   PLT 175 05/03/2009   Lab Results  Component Value Date   NA 136 05/06/2009   K 3.4 (L) 05/06/2009   CL 104 05/06/2009   CO2 27 05/06/2009   Lab  Results  Component Value Date   ALT 21 04/30/2009   AST 13 04/30/2009   ALKPHOS 56 04/30/2009   BILITOT 0.9 04/30/2009     RADIOGRAPHY: MR CHEST W WO CONTRAST  Result Date: 04/14/2020 CLINICAL DATA:  Abnormal bone scan with uptake near the sternomanubrial joint. History of prostate cancer. EXAM: MR CHEST WITH AND WITHOUT CONTRAST TECHNIQUE: Multiplanar, multisequence MR imaging of the chest wall/sternum was performed before and after the administration of intravenous contrast. CONTRAST:  10mL GADAVIST GADOBUTROL 1 MMOL/ML IV SOLN COMPARISON:  Nuclear medicine bone scan dated 03/25/2020. FINDINGS: Bones/Joint/Cartilage: There is heterogeneity of the visualized bone marrow signal of the sternum, clavicular heads, and visualized anterior ribs. No acute fracture. No malalignment. No suspicious marrow replacing bone lesion. There is mild-moderate arthropathy at the sterno manubrial articulation with joint space narrowing and marginal osteophyte formation. There is also mild arthropathy of the bilateral sternoclavicular joints. No joint effusion. No erosion. Muscles and Tendons: Normal signal intensity of the chest wall musculature. Soft tissue: No soft tissue edema or fluid collection. No abnormal enhancement on postcontrast sequences. Cardiomegaly. Mid  ascending thoracic aorta measures approximately 4.0 cm in transverse dimension (series 4, image 22) rounded 1.2 cm T2 hyperintense structure within the anterior aspect of the visualized right hepatic lobe (series 7, image 50). A cyst was seen within this location on prior abdominal CT 04/30/2009. IMPRESSION: 1. No suspicious marrow replacing lesion of the sternum or manubrium. 2. Mild-to-moderate arthropathy of the sternomanubrial articulation. This likely accounts for uptake seen on recent bone scan. 3. Cardiomegaly. 4. Ascending thoracic aorta measures approximately 4.0 cm in diameter. Recommend annual imaging followup by CTA or MRA. This recommendation follows 2010 ACCF/AHA/AATS/ACR/ASA/SCA/SCAI/SIR/STS/SVM Guidelines for the Diagnosis and Management of Patients with Thoracic Aortic Disease. Circulation. 2010; 121: Z610-R604. Aortic aneurysm NOS (ICD10-I71.9) Electronically Signed   By: Duanne Guess D.O.   On: 04/14/2020 11:42   NM Bone Scan Whole Body  Result Date: 03/27/2020 CLINICAL DATA:  Prostate cancer. EXAM: NUCLEAR MEDICINE WHOLE BODY BONE SCAN TECHNIQUE: Whole body anterior and posterior images were obtained approximately 3 hours after intravenous injection of radiopharmaceutical. RADIOPHARMACEUTICALS:  19.7 mCi Technetium-32m MDP IV COMPARISON:  Abdominal/pelvic CT scan from 2011. FINDINGS: Areas of moderate degenerative type uptake noted around both shoulders, at the sternoclavicular joints, both knees and both feet. Focus of uptake noted near the sternal manubrial joint could be degenerative change but could not exclude bone lesion. There is fairly symmetric uptake in the ischial regions bilaterally. Based on the prior CT scan from 2011 there are moderate enthesophytes associated with the hamstring attachments and this likely accounts for the bone scan findings. No significant findings involving the spine, ribs or extremities. IMPRESSION: 1. Focus of uptake noted near the sternomanubrial joint  is an indeterminate finding. MRI of the sternum may be helpful for further evaluation. 2. Uptake in the ischial regions of the pelvis bilaterally likely due to enthesophytes noted on remote CT scan. Electronically Signed   By: Rudie Meyer M.D.   On: 03/27/2020 08:17      IMPRESSION/PLAN:  1. 75 y.o. gentleman with Stage T1c adenocarcinoma of the prostate with Gleason Score of 4+4, and PSA of 9.77. We discussed the patient's workup and outlined the nature of prostate cancer in this setting. The patient's T stage, Gleason's score, and PSA put him into the high risk group.  He had a CT chest this morning for further evaluation of the abnormality seen on recent bone scan and pending there is  no indication of metastases, he is eligible for a variety of potential treatment options including prostatectomy or LT-ADT in combination with either 8 weeks of external radiation or 5 weeks of external radiation preceded by a brachytherapy boost. We discussed the available radiation techniques, and focused on the details and logistics of delivery. We discussed and outlined the risks, benefits, short and long-term effects associated with radiotherapy and compared and contrasted these with prostatectomy. We discussed the role of SpaceOAR in reducing the rectal toxicity associated with radiotherapy. We also detailed the role of ADT in the treatment of high risk prostate cancer and outlined the associated side effects that could be expected with this therapy. He appears to have a good understanding of his disease and our treatment recommendations which are of curative intent.  He was encouraged to ask questions that were answered to his stated satisfaction.  At the end of the conversation, the patient is undecided regarding definitive treatment and prefers to take some additional time to consider the pros and cons of RALP versus LT-ADT concurrent with brachytherapy boost and 5 weeks of EBRT. We will share our discussion with  Dr. Cardell Peach. He has our contact information and plans to let us or Dr. Cardell Peach know his final decision in the coming weeks. We look forward to hearing from him and potentially continuing to participate in his care should he elect to proceed with radiotherapy.    Marguarite Arbour, PA-C    Margaretmary Dys, MD  [facility]  Radiation Oncology Direct Dial: [phone]  Fax: [phone] [email or web address]  Skype  [email or web address]   This document serves as a record of services personally performed by Margaretmary Dys, MD and Marcello Fennel, PA-C. It was created on their behalf by Mickie Bail, a trained medical scribe. The creation of this record is based on the scribe's personal observations and the provider's statements to them. This document has been checked and approved by the attending provider.

## 2020-04-17 DIAGNOSIS — R350 Frequency of micturition: Secondary | ICD-10-CM | POA: Diagnosis not present

## 2020-04-21 ENCOUNTER — Telehealth: Payer: Self-pay | Admitting: Radiation Oncology

## 2020-04-21 NOTE — Telephone Encounter (Signed)
Received voicemail message from patient's wife, Franklin Hall, requesting a callback from [employer], PA-C. Phoned Franklin Hall back to inquire.   Franklin Hall states, "we have a million questions and [name] has been the most straightforward and easy to understand." She questions if her husband should seek a second opinion. She questions what happens if the radiation doesn't work for her husband. She questions what the "right decision is" (reference treatment) since he is very active and continues to work full time. She questions if there is more than one kind of radiation. She ask about thermal ablation.   Franklin Hall explains they would like to hear back as soon as possible since they want to get back to Dr. Abner Greenspan by Friday with their final decision.   Explained this RN would relay this information and myself or [name] will be in contact.

## 2020-04-28 ENCOUNTER — Telehealth: Payer: Self-pay | Admitting: *Deleted

## 2020-04-28 NOTE — Telephone Encounter (Signed)
RECEIVED PHONE CALL FROM [name] STATING THAT THEY HAVE MOVED THIS PATIENT'S ADT APPT. TO 05-27-20- ARRIVAL TIME- 10:30 AM, @ [name] OFFICE @ [name] SPOKE WITH PATIENT'S WIFE- [name] AND SHS IS AWARE OF THIS APPT.

## 2020-04-28 NOTE — Telephone Encounter (Signed)
CALLED PATIENT TO INFORM OF APPT. FOR ADT ON 05-18-20- ARRIVAL TIME- 10:30 AM @ [name] OFFICE, LVM FOR A RETURN CALL

## 2020-05-08 DIAGNOSIS — Z5111 Encounter for antineoplastic chemotherapy: Secondary | ICD-10-CM | POA: Diagnosis not present

## 2020-05-19 ENCOUNTER — Telehealth: Payer: Self-pay | Admitting: *Deleted

## 2020-05-19 ENCOUNTER — Other Ambulatory Visit: Payer: Self-pay | Admitting: Urology

## 2020-05-19 DIAGNOSIS — C61 Malignant neoplasm of prostate: Secondary | ICD-10-CM

## 2020-05-19 NOTE — Telephone Encounter (Signed)
Called patient's wife, Anderson Malta of pre-seed appts. for 06-18-20 and his implant for 08-03-20, spoke with patient's wife- Anderson Malta and she is aware of these appts.

## 2020-05-19 NOTE — Telephone Encounter (Signed)
CALLED PATIENT'S WIFE- [name], SPOKE WITH PATIENT'S WIFE- [name]

## 2020-05-19 NOTE — Telephone Encounter (Signed)
PATIENT HAD ADT ON 05-08-20 @ [name] OFFICE.

## 2020-06-17 ENCOUNTER — Telehealth: Payer: Self-pay | Admitting: *Deleted

## 2020-06-17 NOTE — Telephone Encounter (Signed)
Returned patient's wife's phone call, spoke with patient's wife and reminded of 06-18-20 - pre-seed appts, patient's wife- verified understanding these appts.

## 2020-06-18 ENCOUNTER — Ambulatory Visit (HOSPITAL_COMMUNITY)
Admission: RE | Admit: 2020-06-18 | Discharge: 2020-06-18 | Disposition: A | Discharge status: Home / Self Care | Payer: Medicare Other | Source: Ambulatory Visit | Attending: Urology | Admitting: Urology

## 2020-06-18 ENCOUNTER — Ambulatory Visit
Admission: RE | Admit: 2020-06-18 | Discharge: 2020-06-18 | Disposition: A | Discharge status: Home / Self Care | Payer: Medicare Other | Source: Ambulatory Visit | Attending: Radiation Oncology | Admitting: Radiation Oncology

## 2020-06-18 ENCOUNTER — Encounter (HOSPITAL_COMMUNITY)
Admission: RE | Admit: 2020-06-18 | Discharge: 2020-06-18 | Disposition: A | Discharge status: Home / Self Care | Payer: Medicare Other | Source: Ambulatory Visit | Attending: Urology | Admitting: Urology

## 2020-06-18 ENCOUNTER — Ambulatory Visit
Admission: RE | Admit: 2020-06-18 | Discharge: 2020-06-18 | Disposition: A | Discharge status: Home / Self Care | Payer: Medicare Other | Source: Ambulatory Visit | Attending: Urology | Admitting: Urology

## 2020-06-18 ENCOUNTER — Other Ambulatory Visit: Payer: Self-pay

## 2020-06-18 DIAGNOSIS — C61 Malignant neoplasm of prostate: Secondary | ICD-10-CM

## 2020-06-18 DIAGNOSIS — M47814 Spondylosis without myelopathy or radiculopathy, thoracic region: Secondary | ICD-10-CM | POA: Diagnosis not present

## 2020-06-18 DIAGNOSIS — Z01818 Encounter for other preprocedural examination: Secondary | ICD-10-CM | POA: Diagnosis not present

## 2020-06-18 NOTE — Progress Notes (Signed)
  Radiation Oncology         [phone]) [phone] ________________________________  Name: Franklin Hall MRN: [id number]  Date: 06/18/2020  DOB: 06-Dec-1945  SIMULATION AND TREATMENT PLANNING NOTE PUBIC ARCH STUDY  TK:PTWSF, Herbie [place], MD  [name], MD  DIAGNOSIS:  75 y.o. gentleman with Stage T1c adenocarcinoma of the prostate with Gleason score of 4+4, and PSA of 9.77.  Oncology History  Malignant neoplasm of prostate ([place])  03/05/2020 Cancer Staging   Staging form: Prostate, AJCC 8th Edition - Clinical stage from 03/05/2020: Stage IIC (cT1c, cN0, cM0, PSA: 9.8, Grade Group: 4) - Signed by Freeman Caldron, PA-C on 04/14/2020 Histopathologic type: Adenocarcinoma, NOS Stage prefix: Initial diagnosis Prostate specific antigen (PSA) range: Less than 10 Gleason primary pattern: 4 Gleason secondary pattern: 4 Gleason score: 8 Histologic grading system: 5 grade system Number of biopsy cores examined: 12 Number of biopsy cores positive: 4 Location of positive needle core biopsies: Both sides   04/14/2020 Initial Diagnosis   Malignant neoplasm of prostate ([place])       ICD-10-CM   1. Malignant neoplasm of prostate ([place])  C61     COMPLEX SIMULATION:  The patient presented today for evaluation for possible prostate seed implant. He was brought to the radiation planning suite and placed supine on the CT couch. A 3-dimensional image study set was obtained in upload to the planning computer. There, on each axial slice, I contoured the prostate gland. Then, using three-dimensional radiation planning tools I reconstructed the prostate in view of the structures from the transperineal needle pathway to assess for possible pubic arch interference. In doing so, I did not appreciate any pubic arch interference. Also, the patient's prostate volume was estimated based on the drawn structure. The volume was 20 cc.  Given the pubic arch appearance and prostate volume, patient remains a good candidate to proceed with  prostate seed implant. Today, he freely provided informed written consent to proceed.    PLAN: The patient will undergo prostate seed implant boost followed by IMRT   ________________________________  Sheral Apley. Tammi Klippel, M.D.

## 2020-06-19 ENCOUNTER — Encounter (HOSPITAL_COMMUNITY): Admission: RE | Admit: 2020-06-19 | Payer: Medicare Other | Source: Ambulatory Visit

## 2020-07-27 ENCOUNTER — Telehealth: Payer: Self-pay | Admitting: *Deleted

## 2020-07-27 NOTE — Telephone Encounter (Signed)
CALLED PATIENT TO REMIND OF LABS ON 07-30-20, LVM FOR A RETURN CALL

## 2020-07-29 ENCOUNTER — Encounter (HOSPITAL_BASED_OUTPATIENT_CLINIC_OR_DEPARTMENT_OTHER): Payer: Self-pay | Admitting: Urology

## 2020-07-29 ENCOUNTER — Other Ambulatory Visit: Payer: Self-pay

## 2020-07-29 NOTE — Progress Notes (Signed)
Spoke w/ via phone for pre-op interview--- Pt Lab needs dos----  no             Lab results------ pt getting CBC, CMP, PT/ PTT done 07-30-2020 @ 1145 COVID test -----patient states asymptomatic no test needed Arrive at -------  0845 on 08-03-2020 NPO after MN NO Solid Food.  Clear liquids from MN until--- 0745 Med rec completed Medications to take morning of surgery ----- Zyrtec, Lopressor Diabetic medication ----- n/a Patient instructed no nail polish to be worn day of surgery Patient instructed to bring photo id and insurance card day of surgery Patient aware to have Driver (ride ) / caregiver for 24 hours after surgery -- wife, Franklin Hall Patient Special Instructions ----- will do one fleet enema morning of surgery  Pre-Op special Istructions ----- n/a Patient verbalized understanding of instructions that were given at this phone interview. Patient denies shortness of breath, chest pain, fever, cough at this phone interview.

## 2020-07-30 ENCOUNTER — Encounter (HOSPITAL_COMMUNITY): Admission: RE | Admit: 2020-07-30 | Payer: Medicare Other | Source: Ambulatory Visit

## 2020-07-30 ENCOUNTER — Encounter (HOSPITAL_COMMUNITY)
Admission: RE | Admit: 2020-07-30 | Discharge: 2020-07-30 | Disposition: A | Discharge status: Home / Self Care | Payer: Medicare Other | Source: Ambulatory Visit | Attending: Urology | Admitting: Urology

## 2020-07-30 DIAGNOSIS — Z01812 Encounter for preprocedural laboratory examination: Secondary | ICD-10-CM | POA: Diagnosis not present

## 2020-07-30 LAB — COMPREHENSIVE METABOLIC PANEL
ALT: 31 U/L (ref 0–44)
AST: 21 U/L (ref 15–41)
Albumin: 4.1 g/dL (ref 3.5–5.0)
Alkaline Phosphatase: 63 U/L (ref 38–126)
Anion gap: 8 (ref 5–15)
BUN: 23 mg/dL (ref 8–23)
CO2: 23 mmol/L (ref 22–32)
Calcium: 10.4 mg/dL — ABNORMAL HIGH (ref 8.9–10.3)
Chloride: 105 mmol/L (ref 98–111)
Creatinine, Ser: 1.02 mg/dL (ref 0.61–1.24)
GFR, Estimated: >60 mL/min (ref >60)
Glucose, Bld: 108 mg/dL — ABNORMAL HIGH (ref 70–99)
Potassium: 4 mmol/L (ref 3.5–5.1)
Sodium: 136 mmol/L (ref 135–145)
Total Bilirubin: 0.7 mg/dL (ref 0.3–1.2)
Total Protein: 7.6 g/dL (ref 6.5–8.1)

## 2020-07-30 LAB — APTT: aPTT: 30 seconds (ref 24–36)

## 2020-07-30 LAB — CBC
HCT: 39.3 % (ref 39.0–52.0)
Hemoglobin: 12.6 g/dL — ABNORMAL LOW (ref 13.0–17.0)
MCH: 27.2 pg (ref 26.0–34.0)
MCHC: 32.1 g/dL (ref 30.0–36.0)
MCV: 84.9 fL (ref 80.0–100.0)
Platelets: 165 10*3/uL (ref 150–400)
RBC: 4.63 MIL/uL (ref 4.22–5.81)
RDW: 12.9 % (ref 11.5–15.5)
WBC: 5.7 10*3/uL (ref 4.0–10.5)
nRBC: 0 % (ref 0.0–0.2)

## 2020-07-30 LAB — PROTIME-INR
INR: 0.9 (ref 0.8–1.2)
Prothrombin Time: 12.3 seconds (ref 11.4–15.2)

## 2020-07-31 ENCOUNTER — Telehealth: Payer: Self-pay | Admitting: *Deleted

## 2020-07-31 NOTE — Telephone Encounter (Signed)
CALLED PATIENT TO REMIND OF PROCEDURE FOR 08-03-20, SPOKE WITH PATIENT'S WIFE, [name] AND SHE IS AWARE OF THIS PROCEDURE

## 2020-08-03 ENCOUNTER — Encounter (HOSPITAL_BASED_OUTPATIENT_CLINIC_OR_DEPARTMENT_OTHER)
Admission: RE | Disposition: A | Discharge status: Home / Self Care | Payer: Self-pay | Source: Ambulatory Visit | Attending: Urology

## 2020-08-03 ENCOUNTER — Ambulatory Visit (HOSPITAL_BASED_OUTPATIENT_CLINIC_OR_DEPARTMENT_OTHER)
Admission: RE | Admit: 2020-08-03 | Discharge: 2020-08-03 | Disposition: A | Discharge status: Home / Self Care | Payer: Medicare Other | Source: Ambulatory Visit | Attending: Urology | Admitting: Urology

## 2020-08-03 ENCOUNTER — Ambulatory Visit (HOSPITAL_BASED_OUTPATIENT_CLINIC_OR_DEPARTMENT_OTHER): Payer: Medicare Other | Admitting: Anesthesiology

## 2020-08-03 ENCOUNTER — Encounter (HOSPITAL_BASED_OUTPATIENT_CLINIC_OR_DEPARTMENT_OTHER): Payer: Self-pay | Admitting: Urology

## 2020-08-03 ENCOUNTER — Ambulatory Visit (HOSPITAL_COMMUNITY): Payer: Medicare Other

## 2020-08-03 DIAGNOSIS — Z79899 Other long term (current) drug therapy: Secondary | ICD-10-CM | POA: Insufficient documentation

## 2020-08-03 DIAGNOSIS — I10 Essential (primary) hypertension: Secondary | ICD-10-CM | POA: Insufficient documentation

## 2020-08-03 DIAGNOSIS — K219 Gastro-esophageal reflux disease without esophagitis: Secondary | ICD-10-CM | POA: Diagnosis not present

## 2020-08-03 DIAGNOSIS — C61 Malignant neoplasm of prostate: Secondary | ICD-10-CM | POA: Diagnosis present

## 2020-08-03 DIAGNOSIS — Z8042 Family history of malignant neoplasm of prostate: Secondary | ICD-10-CM | POA: Insufficient documentation

## 2020-08-03 DIAGNOSIS — D630 Anemia in neoplastic disease: Secondary | ICD-10-CM | POA: Diagnosis not present

## 2020-08-03 HISTORY — DX: Unspecified symptoms and signs involving the genitourinary system: R39.9

## 2020-08-03 HISTORY — DX: Presence of spectacles and contact lenses: Z97.3

## 2020-08-03 HISTORY — DX: Aortic aneurysm of unspecified site, without rupture: I71.9

## 2020-08-03 HISTORY — DX: Presence of dental prosthetic device (complete) (partial): Z97.2

## 2020-08-03 HISTORY — DX: Gastro-esophageal reflux disease without esophagitis: K21.9

## 2020-08-03 HISTORY — DX: Male erectile dysfunction, unspecified: N52.9

## 2020-08-03 HISTORY — PX: RADIOACTIVE SEED IMPLANT: SHX5150

## 2020-08-03 HISTORY — DX: Personal history of urinary calculi: Z87.442

## 2020-08-03 HISTORY — PX: SPACE OAR INSTILLATION: SHX6769

## 2020-08-03 HISTORY — PX: CYSTOSCOPY: SHX5120

## 2020-08-03 SURGERY — INSERTION, RADIATION SOURCE, PROSTATE
Anesthesia: General | Site: Rectum

## 2020-08-03 MED ORDER — LIDOCAINE 2% (20 MG/ML) 5 ML SYRINGE
INTRAMUSCULAR | Status: DC | PRN
  Administered 2020-08-03: 100 mg via INTRAVENOUS

## 2020-08-03 MED ORDER — FENTANYL CITRATE (PF) 100 MCG/2ML IJ SOLN: 25.0000 ug | INTRAMUSCULAR | Status: DC | PRN

## 2020-08-03 MED ORDER — PROPOFOL 10 MG/ML IV BOLUS
INTRAVENOUS | Status: DC | PRN
  Administered 2020-08-03: 20 mg via INTRAVENOUS
  Administered 2020-08-03: 200 mg via INTRAVENOUS
  Administered 2020-08-03: 100 mg via INTRAVENOUS
  Administered 2020-08-03: 20 mg via INTRAVENOUS

## 2020-08-03 MED ORDER — MIDAZOLAM HCL 2 MG/2ML IJ SOLN
INTRAMUSCULAR | Status: DC | PRN
  Administered 2020-08-03: 1 mg via INTRAVENOUS

## 2020-08-03 MED ORDER — ONDANSETRON HCL 4 MG/2ML IJ SOLN
INTRAMUSCULAR | Status: DC | PRN
  Administered 2020-08-03: 4 mg via INTRAVENOUS

## 2020-08-03 MED ORDER — LACTATED RINGERS IV SOLN: INTRAVENOUS | Status: DC

## 2020-08-03 MED ORDER — OXYCODONE HCL 5 MG/5ML PO SOLN: 5.0000 mg | Freq: Once | ORAL | Status: DC | PRN

## 2020-08-03 MED ORDER — ONDANSETRON HCL 4 MG/2ML IJ SOLN: 4.0000 mg | Freq: Once | INTRAMUSCULAR | Status: DC | PRN

## 2020-08-03 MED ORDER — SODIUM CHLORIDE (PF) 0.9 % IJ SOLN
INTRAMUSCULAR | Status: DC | PRN
  Administered 2020-08-03: 10 mL via INTRAVENOUS

## 2020-08-03 MED ORDER — CIPROFLOXACIN IN D5W 400 MG/200ML IV SOLN
400.0000 mg | INTRAVENOUS | Status: AC
  Administered 2020-08-03: 400 mg via INTRAVENOUS

## 2020-08-03 MED ORDER — PROPOFOL 10 MG/ML IV BOLUS
INTRAVENOUS | Status: AC
  Filled 2020-08-03: qty 20

## 2020-08-03 MED ORDER — FENTANYL CITRATE (PF) 100 MCG/2ML IJ SOLN
INTRAMUSCULAR | Status: AC
  Filled 2020-08-03: qty 2

## 2020-08-03 MED ORDER — AMISULPRIDE (ANTIEMETIC) 5 MG/2ML IV SOLN: 10.0000 mg | Freq: Once | INTRAVENOUS | Status: DC | PRN

## 2020-08-03 MED ORDER — EPHEDRINE SULFATE-NACL 50-0.9 MG/10ML-% IV SOSY
PREFILLED_SYRINGE | INTRAVENOUS | Status: DC | PRN
  Administered 2020-08-03: 10 mg via INTRAVENOUS
  Administered 2020-08-03: 15 mg via INTRAVENOUS
  Administered 2020-08-03: 10 mg via INTRAVENOUS
  Administered 2020-08-03: 15 mg via INTRAVENOUS

## 2020-08-03 MED ORDER — LIDOCAINE HCL (PF) 2 % IJ SOLN
INTRAMUSCULAR | Status: AC
  Filled 2020-08-03: qty 5

## 2020-08-03 MED ORDER — FENTANYL CITRATE (PF) 100 MCG/2ML IJ SOLN
INTRAMUSCULAR | Status: DC | PRN
  Administered 2020-08-03: 50 ug via INTRAVENOUS
  Administered 2020-08-03: 25 ug via INTRAVENOUS

## 2020-08-03 MED ORDER — EPHEDRINE 5 MG/ML INJ
INTRAVENOUS | Status: AC
  Filled 2020-08-03: qty 10

## 2020-08-03 MED ORDER — CIPROFLOXACIN IN D5W 400 MG/200ML IV SOLN
INTRAVENOUS | Status: AC
  Filled 2020-08-03: qty 200

## 2020-08-03 MED ORDER — OXYCODONE HCL 5 MG PO TABS: 5.0000 mg | ORAL_TABLET | Freq: Once | ORAL | Status: DC | PRN

## 2020-08-03 MED ORDER — PHENYLEPHRINE 40 MCG/ML (10ML) SYRINGE FOR IV PUSH (FOR BLOOD PRESSURE SUPPORT)
PREFILLED_SYRINGE | INTRAVENOUS | Status: AC
  Filled 2020-08-03: qty 10

## 2020-08-03 MED ORDER — SODIUM CHLORIDE 0.9 % IV SOLN
INTRAVENOUS | Status: AC | PRN
  Administered 2020-08-03: 1000 mL via INTRAMUSCULAR

## 2020-08-03 MED ORDER — OXYCODONE-ACETAMINOPHEN 5-325 MG PO TABS: 1.0000 | ORAL_TABLET | ORAL | 0 refills | Status: AC | PRN

## 2020-08-03 MED ORDER — PHENYLEPHRINE 40 MCG/ML (10ML) SYRINGE FOR IV PUSH (FOR BLOOD PRESSURE SUPPORT)
PREFILLED_SYRINGE | INTRAVENOUS | Status: DC | PRN
  Administered 2020-08-03 (×4): 80 ug via INTRAVENOUS

## 2020-08-03 MED ORDER — ONDANSETRON HCL 4 MG/2ML IJ SOLN
INTRAMUSCULAR | Status: AC
  Filled 2020-08-03: qty 2

## 2020-08-03 MED ORDER — DEXAMETHASONE SODIUM PHOSPHATE 10 MG/ML IJ SOLN
INTRAMUSCULAR | Status: DC | PRN
  Administered 2020-08-03: 10 mg via INTRAVENOUS

## 2020-08-03 MED ORDER — MEGESTROL ACETATE 20 MG PO TABS: 20.0000 mg | ORAL_TABLET | Freq: Every day | ORAL | 11 refills | Status: AC

## 2020-08-03 MED ORDER — STERILE WATER FOR IRRIGATION IR SOLN
Status: DC | PRN
  Administered 2020-08-03: 500 mL

## 2020-08-03 MED ORDER — IOHEXOL 300 MG/ML  SOLN
INTRAMUSCULAR | Status: DC | PRN
  Administered 2020-08-03: 7 mL

## 2020-08-03 MED ORDER — MIDAZOLAM HCL 2 MG/2ML IJ SOLN
INTRAMUSCULAR | Status: AC
  Filled 2020-08-03: qty 2

## 2020-08-03 MED ORDER — CEPHALEXIN 500 MG PO CAPS: 500.0000 mg | ORAL_CAPSULE | Freq: Two times a day (BID) | ORAL | 0 refills | Status: AC

## 2020-08-03 MED ORDER — FLEET ENEMA 7-19 GM/118ML RE ENEM: 1.0000 | ENEMA | Freq: Once | RECTAL | Status: DC

## 2020-08-03 MED ORDER — DEXAMETHASONE SODIUM PHOSPHATE 10 MG/ML IJ SOLN
INTRAMUSCULAR | Status: AC
  Filled 2020-08-03: qty 1

## 2020-08-03 MED ORDER — DOCUSATE SODIUM 100 MG PO CAPS: 100.0000 mg | ORAL_CAPSULE | Freq: Every day | ORAL | 0 refills | Status: AC | PRN

## 2020-08-03 SURGICAL SUPPLY — 39 items
BAG DRN RND TRDRP ANRFLXCHMBR (UROLOGICAL SUPPLIES) ×3
BAG URINE DRAIN 2000ML AR STRL (UROLOGICAL SUPPLIES) ×4 IMPLANT
BLADE CLIPPER SENSICLIP SURGIC (BLADE) ×4 IMPLANT
CATH FOLEY 2WAY SLVR  5CC 16FR (CATHETERS) ×4
CATH FOLEY 2WAY SLVR 5CC 16FR (CATHETERS) ×3 IMPLANT
CATH ROBINSON RED A/P 16FR (CATHETERS) IMPLANT
CATH ROBINSON RED A/P 20FR (CATHETERS) ×4 IMPLANT
CLOTH BEACON ORANGE TIMEOUT ST (SAFETY) ×4 IMPLANT
CNTNR URN SCR LID CUP LEK RST (MISCELLANEOUS) ×6 IMPLANT
CONT SPEC 4OZ STRL OR WHT (MISCELLANEOUS) ×8
COVER BACK TABLE 60X90IN (DRAPES) ×4 IMPLANT
COVER MAYO STAND STRL (DRAPES) ×4 IMPLANT
DRAPE C-ARM 35X43 STRL (DRAPES) ×4 IMPLANT
DRSG TEGADERM 4X4.75 (GAUZE/BANDAGES/DRESSINGS) ×4 IMPLANT
DRSG TEGADERM 8X12 (GAUZE/BANDAGES/DRESSINGS) ×4 IMPLANT
GAUZE SPONGE 4X4 12PLY STRL (GAUZE/BANDAGES/DRESSINGS) ×4 IMPLANT
GLOVE SURG ENC MOIS LTX SZ6 (GLOVE) IMPLANT
GLOVE SURG ENC MOIS LTX SZ6.5 (GLOVE) IMPLANT
GLOVE SURG ENC MOIS LTX SZ7 (GLOVE) ×4 IMPLANT
GLOVE SURG ENC MOIS LTX SZ8 (GLOVE) IMPLANT
GLOVE SURG ORTHO LTX SZ8.5 (GLOVE) ×16 IMPLANT
GLOVE SURG POLYISO LF SZ6.5 (GLOVE) IMPLANT
GLOVE SURG POLYISO LF SZ7 (GLOVE) ×8 IMPLANT
GLOVE SURG UNDER POLY LF SZ7.5 (GLOVE) ×4 IMPLANT
GOWN STRL REUS W/TWL LRG LVL3 (GOWN DISPOSABLE) ×8 IMPLANT
HOLDER FOLEY CATH W/STRAP (MISCELLANEOUS) IMPLANT
I-SEED AGX100 ×236 IMPLANT
IMPL SPACEOAR VUE SYSTEM (Spacer) ×3 IMPLANT
IMPLANT SPACEOAR VUE SYSTEM (Spacer) ×4 IMPLANT
IV NS 1000ML (IV SOLUTION) ×4
IV NS 1000ML BAXH (IV SOLUTION) ×3 IMPLANT
KIT TURNOVER CYSTO (KITS) ×4 IMPLANT
MARKER SKIN DUAL TIP RULER LAB (MISCELLANEOUS) ×4 IMPLANT
PACK CYSTO (CUSTOM PROCEDURE TRAY) ×4 IMPLANT
SUT BONE WAX W31G (SUTURE) IMPLANT
SYR 10ML LL (SYRINGE) ×4 IMPLANT
TOWEL OR 17X26 10 PK STRL BLUE (TOWEL DISPOSABLE) ×4 IMPLANT
UNDERPAD 30X36 HEAVY ABSORB (UNDERPADS AND DIAPERS) ×8 IMPLANT
WATER STERILE IRR 500ML POUR (IV SOLUTION) ×4 IMPLANT

## 2020-08-03 NOTE — Discharge Instructions (Signed)

## 2020-08-03 NOTE — Op Note (Signed)
PATIENT:  Franklin Hall  PRE-OPERATIVE DIAGNOSIS:  Adenocarcinoma of the prostate  POST-OPERATIVE DIAGNOSIS:  Same  PROCEDURE:  1. I-125 radioactive seed implantation 2. Cystoscopy  3. Placement of SpaceOAR  SURGEON:  Rexene Alberts, MD  Radiation oncologist: Tyler Pita, MD  ANESTHESIA:  General  EBL:  Minimal  DRAINS: None  INDICATION: Franklin Hall  Description of procedure: After informed consent the patient was brought to the major OR, placed on the table and administered general anesthesia. He was then moved to the modified lithotomy position with his perineum perpendicular to the floor. His perineum and genitalia were then sterilely prepped. An official timeout was then performed. A 16 French Foley catheter was then placed in the bladder and filled with dilute contrast, a rectal tube was placed in the rectum and the transrectal ultrasound probe was placed in the rectum and affixed to the stand. He was then sterilely draped.  Real time ultrasonography was used along with the seed planning software. This was used to develop the seed plan including the number of needles as well as number of seeds required for complete and adequate coverage. Real-time ultrasonography was then used along with the previously developed plan and the Nucletron device to implant a total of 59 seeds using 15 needles. This proceeded without difficulty or complication.   I then proceeded with placement of SpaceOAR by introducing a needle with the bevel angled inferiorly approximately 2 cm superior to the anus. This was angled downward and under direct ultrasound was placed within the space between the prostatic capsule and rectum. This was confirmed with a small amount of sterile saline injected and this was performed under direct ultrasound. I then attached the SpaceOAR to the needle and injected this in the space between the prostate and rectum with good placement noted.  A Foley catheter was then removed  as well as the transrectal ultrasound probe and rectal probe. Flexible cystoscopy was then performed using the 16 French flexible scope which revealed a normal urethra throughout its length down to the sphincter which appeared intact. The prostatic urethra revealed bilobar hypertrophy but no evidence of obstruction, seeds, spacers or lesions. There was a tiny blood clot at the base of the bladder that was removed with a  grasper. The bladder was then entered and fully and systematically inspected. The ureteral orifices were noted to be of normal configuration and position. The mucosa revealed no evidence of tumors. There were also no stones identified within the bladder. I noted no seeds or spacers on the floor of the bladder and retroflexion of the scope revealed no seeds protruding from the base of the prostate.  The cystoscope was then removed and the patient was awakened and taken to recovery room in stable and satisfactory condition. He tolerated procedure well and there were no intraoperative complications.  [facility]  Pager: [phone]

## 2020-08-03 NOTE — Anesthesia Preprocedure Evaluation (Signed)
Anesthesia Evaluation  Patient identified by MRN, date of birth, ID band Patient awake    Reviewed: Allergy & Precautions, NPO status , Patient's Chart, lab work & pertinent test results  History of Anesthesia Complications Negative for: history of anesthetic complications  Airway Mallampati: II  TM Distance: >3 FB Neck ROM: Full    Dental  (+) Partial Upper, Dental Advisory Given   Pulmonary neg pulmonary ROS,    Pulmonary exam normal        Cardiovascular hypertension, Normal cardiovascular exam     Neuro/Psych negative neurological ROS     GI/Hepatic Neg liver ROS, GERD  ,  Endo/Other  negative endocrine ROS  Renal/GU negative Renal ROS   Prostate cancer    Musculoskeletal negative musculoskeletal ROS (+)   Abdominal   Peds  Hematology  (+) anemia ,   Anesthesia Other Findings   Reproductive/Obstetrics                            Anesthesia Physical Anesthesia Plan  ASA: 2  Anesthesia Plan: General   Post-op Pain Management:    Induction: Intravenous  PONV Risk Score and Plan: 2 and Ondansetron, Dexamethasone, Midazolam and Treatment may vary due to age or medical condition  Airway Management Planned: LMA  Additional Equipment: None  Intra-op Plan:   Post-operative Plan: Extubation in OR  Informed Consent: I have reviewed the patients History and Physical, chart, labs and discussed the procedure including the risks, benefits and alternatives for the proposed anesthesia with the patient or authorized representative who has indicated his/her understanding and acceptance.     Dental advisory given  Plan Discussed with:   Anesthesia Plan Comments:         Anesthesia Quick Evaluation

## 2020-08-03 NOTE — Anesthesia Postprocedure Evaluation (Signed)
Anesthesia Post Note  Patient: Franklin Hall  Procedure(s) Performed: RADIOACTIVE SEED IMPLANT/BRACHYTHERAPY IMPLANT (Prostate) SPACE OAR INSTILLATION (Rectum) CYSTOSCOPY FLEXIBLE (Bladder)     Patient location during evaluation: PACU Anesthesia Type: General Level of consciousness: awake and alert Pain management: pain level controlled Vital Signs Assessment: post-procedure vital signs reviewed and stable Respiratory status: spontaneous breathing, nonlabored ventilation and respiratory function stable Cardiovascular status: blood pressure returned to baseline and stable Postop Assessment: no apparent nausea or vomiting Anesthetic complications: no   No notable events documented.  Last Vitals:  Vitals:   08/03/20 1230 08/03/20 1245  BP: (!) 155/92 (!) 156/91  Pulse: 68 (!) 57  Resp: 15 12  Temp:    SpO2: 98% 99%    Last Pain:  Vitals:   08/03/20 1245  TempSrc:   PainSc: 0-No pain                 Lidia Collum
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
## 2020-08-03 NOTE — Progress Notes (Signed)
  Radiation Oncology         [phone] ________________________________  Name: Franklin Hall MRN: [id number]  Date: 08/03/2020  DOB: 11-24-1945       Prostate Seed Implant  SW:[id number], Annie Main, MD  No ref. provider found  DIAGNOSIS:  75 y.o. gentleman with Stage T1c adenocarcinoma of the prostate with Gleason score of 4+4, and PSA of 9.77.  Oncology History  Malignant neoplasm of prostate ([name])  03/05/2020 Cancer Staging   Staging form: Prostate, AJCC 8th Edition - Clinical stage from 03/05/2020: Stage IIC (cT1c, cN0, cM0, PSA: 9.8, Grade Group: 4) - Signed by Freeman Caldron, PA-C on 04/14/2020  Histopathologic type: Adenocarcinoma, NOS  Stage prefix: Initial diagnosis  Prostate specific antigen (PSA) range: Less than 10  Gleason primary pattern: 4  Gleason secondary pattern: 4  Gleason score: 8  Histologic grading system: 5 grade system  Number of biopsy cores examined: 12  Number of biopsy cores positive: 4  Location of positive needle core biopsies: Both sides    04/14/2020 Initial Diagnosis   Malignant neoplasm of prostate ([name])        ICD-10-CM   1. Malignant neoplasm of prostate [name])  C61 Discharge patient      PROCEDURE: Insertion of radioactive I-125 seeds into the prostate gland.  RADIATION DOSE: 110 Gy, boost therapy.  TECHNIQUE: Franklin Hall was brought to the operating room with the urologist. He was placed in the dorsolithotomy position. He was catheterized and a rectal tube was inserted. The perineum was shaved, prepped and draped. The ultrasound probe was then introduced into the rectum to see the prostate gland.  TREATMENT DEVICE: A needle grid was attached to the ultrasound probe stand and anchor needles were placed.  3D PLANNING: The prostate was imaged in 3D using a sagittal sweep of the prostate probe. These images were transferred to the planning computer. There, the prostate, urethra and rectum were defined on each axial reconstructed  image. Then, the software created an optimized 3D plan and a few seed positions were adjusted. The quality of the plan was reviewed using [name] information for the target and the following two organs at risk:  Urethra and Rectum.  Then the accepted plan was printed and handed off to the radiation therapist.  Under my supervision, the custom loading of the seeds and spacers was carried out and loaded into sealed vicryl sleeves.  These pre-loaded needles were then placed into the needle holder.Marland Kitchen  PROSTATE VOLUME STUDY:  Using transrectal ultrasound the volume of the prostate was verified to be 21.6 cc.  SPECIAL TREATMENT PROCEDURE/SUPERVISION AND HANDLING: The pre-loaded needles were then delivered under sagittal guidance. A total of 15 needles were used to deposit 59 seeds in the prostate gland. The individual seed activity was 0.243 mCi.  SpaceOAR:  Yes  COMPLEX SIMULATION: At the end of the procedure, an anterior radiograph of the pelvis was obtained to document seed positioning and count. Cystoscopy was performed to check the urethra and bladder.  MICRODOSIMETRY: At the end of the procedure, the patient was emitting 0.053 mR/hr at 1 meter. Accordingly, he was considered safe for hospital discharge.  PLAN: The patient will return to the radiation oncology clinic for post implant CT dosimetry in three weeks.   ________________________________  Sheral Apley Tammi Klippel, M.D.

## 2020-08-03 NOTE — Anesthesia Procedure Notes (Signed)
Procedure Name: LMA Insertion Date/Time: 08/03/2020 11:05 AM Performed by: Mechele Claude, CRNA Pre-anesthesia Checklist: Patient identified, Emergency Drugs available, Suction available and Patient being monitored Patient Re-evaluated:Patient Re-evaluated prior to induction Oxygen Delivery Method: Circle system utilized Preoxygenation: Pre-oxygenation with 100% oxygen Induction Type: IV induction Ventilation: Mask ventilation without difficulty LMA: LMA inserted LMA Size: 5.0 Number of attempts: 1 Airway Equipment and Method: Bite block Placement Confirmation: positive ETCO2 Tube secured with: Tape Dental Injury: Teeth and Oropharynx as per pre-operative assessment

## 2020-08-03 NOTE — Transfer of Care (Signed)
Immediate Anesthesia Transfer of Care Note  Patient: Franklin Hall  Procedure(s) Performed: Procedure(s) (LRB): RADIOACTIVE SEED IMPLANT/BRACHYTHERAPY IMPLANT (N/A) SPACE OAR INSTILLATION (N/A) CYSTOSCOPY FLEXIBLE  Patient Location: PACU  Anesthesia Type: General  Level of Consciousness: awake, alert  and oriented  Airway & Oxygen Therapy: Patient Spontanous Breathing and Patient connected to nasal cannula oxygen  Post-op Assessment: Report given to PACU RN and Post -op Vital signs reviewed and stable  Post vital signs: Reviewed and stable  Complications: No apparent anesthesia complications  Last Vitals:  Vitals Value Taken Time  BP 151/83 08/03/20 1222  Temp    Pulse 59 08/03/20 1226  Resp 13 08/03/20 1226  SpO2 98 % 08/03/20 1226  Vitals shown include unvalidated device data.  Last Pain:  Vitals:   08/03/20 0917  TempSrc: Oral  PainSc: 0-No pain      Patients Stated Pain Goal: 4 (91/79/15 0569)  Complications: No notable events documented.

## 2020-08-04 ENCOUNTER — Encounter (HOSPITAL_BASED_OUTPATIENT_CLINIC_OR_DEPARTMENT_OTHER): Payer: Self-pay | Admitting: Urology

## 2020-08-17 DIAGNOSIS — R3912 Poor urinary stream: Secondary | ICD-10-CM | POA: Diagnosis not present

## 2020-08-17 DIAGNOSIS — R350 Frequency of micturition: Secondary | ICD-10-CM | POA: Diagnosis not present

## 2020-08-18 ENCOUNTER — Ambulatory Visit: Payer: Medicare Other | Admitting: Radiation Oncology

## 2020-08-20 ENCOUNTER — Telehealth: Payer: Self-pay | Admitting: *Deleted

## 2020-08-20 NOTE — Progress Notes (Signed)
  Radiation Oncology         [phone] ________________________________  Name: Franklin Hall MRN: [id number]  Date: 08/21/2020  DOB: Sep 06, 1945  SIMULATION AND TREATMENT PLANNING NOTE    ICD-10-CM   1. Malignant neoplasm of prostate ([place])  C61       DIAGNOSIS:  75 y.o. gentleman with Stage T1c adenocarcinoma of the prostate with Gleason score of 4+4, and PSA of 9.77.  NARRATIVE:  The patient was brought to the [facility].  Identity was confirmed.  All relevant records and images related to the planned course of therapy were reviewed.  The patient freely provided informed written consent to proceed with treatment after reviewing the details related to the planned course of therapy. The consent form was witnessed and verified by the simulation staff.  Then, the patient was set-up in a stable reproducible supine position for radiation therapy.  A vacuum lock pillow device was custom fabricated to position his legs in a reproducible immobilized position.  Then, I performed a urethrogram under sterile conditions to identify the prostatic apex.  CT images were obtained.  Surface markings were placed.  The CT images were loaded into the planning software.  Then the prostate target and avoidance structures including the rectum, bladder, bowel and hips were contoured.  Treatment planning then occurred.  The radiation prescription was entered and confirmed.  A total of one complex treatment devices were fabricated. I have requested : Intensity Modulated Radiotherapy (IMRT) is medically necessary for this case for the following reason:  Rectal sparing.Marland Kitchen  PLAN:  The patient will receive 45 Gy in 25 fractions of 1.8 Gy, to supplement an up-front prostate seed implant boost of 110 Gy to achieve a total nominal dose of 155 Gy.  ________________________________  Sheral Apley Tammi Klippel, M.D.

## 2020-08-20 NOTE — Progress Notes (Signed)
  Radiation Oncology         [phone] ________________________________  Name: Franklin Hall MRN: [id number]  Date: 08/21/2020  DOB: 1945/04/28  COMPLEX SIMULATION NOTE  NARRATIVE:  The patient was brought to the [facility] today following prostate seed implantation approximately one month ago.  Identity was confirmed.  All relevant records and images related to the planned course of therapy were reviewed.  Then, the patient was set-up supine.  CT images were obtained.  The CT images were loaded into the planning software.  Then the prostate and rectum were contoured.  Treatment planning then occurred.  The implanted iodine 125 seeds were identified by the physics staff for projection of radiation distribution  I have requested : 3D Simulation  I have requested a DVH of the following structures: Prostate and rectum.    ________________________________  Sheral Apley Tammi Klippel, M.D.

## 2020-08-20 NOTE — Telephone Encounter (Signed)
CALLED PATIENT TO REMIND OF SIM APPT. FOR 08-21-20- ARRIVAL TIME - 10:15 AM, LVM FOR A RETURN CALL

## 2020-08-21 ENCOUNTER — Ambulatory Visit
Admission: RE | Admit: 2020-08-21 | Discharge: 2020-08-21 | Disposition: A | Discharge status: Home / Self Care | Payer: Medicare Other | Source: Ambulatory Visit | Attending: Radiation Oncology | Admitting: Radiation Oncology

## 2020-08-21 ENCOUNTER — Other Ambulatory Visit: Payer: Self-pay

## 2020-08-21 DIAGNOSIS — Z510 Encounter for antineoplastic radiation therapy: Secondary | ICD-10-CM | POA: Diagnosis not present

## 2020-08-21 DIAGNOSIS — C61 Malignant neoplasm of prostate: Secondary | ICD-10-CM

## 2020-08-29 DIAGNOSIS — Z510 Encounter for antineoplastic radiation therapy: Secondary | ICD-10-CM | POA: Diagnosis not present

## 2020-09-02 ENCOUNTER — Ambulatory Visit
Admission: RE | Admit: 2020-09-02 | Discharge: 2020-09-02 | Disposition: A | Discharge status: Home / Self Care | Payer: Medicare Other | Source: Ambulatory Visit | Attending: Radiation Oncology | Admitting: Radiation Oncology

## 2020-09-02 ENCOUNTER — Other Ambulatory Visit: Payer: Self-pay

## 2020-09-02 DIAGNOSIS — Z510 Encounter for antineoplastic radiation therapy: Secondary | ICD-10-CM | POA: Diagnosis not present

## 2020-09-03 ENCOUNTER — Ambulatory Visit
Admission: RE | Admit: 2020-09-03 | Discharge: 2020-09-03 | Disposition: A | Discharge status: Home / Self Care | Payer: Medicare Other | Source: Ambulatory Visit | Attending: Radiation Oncology | Admitting: Radiation Oncology

## 2020-09-03 DIAGNOSIS — Z510 Encounter for antineoplastic radiation therapy: Secondary | ICD-10-CM | POA: Diagnosis not present

## 2020-09-04 ENCOUNTER — Other Ambulatory Visit: Payer: Self-pay

## 2020-09-04 ENCOUNTER — Ambulatory Visit
Admission: RE | Admit: 2020-09-04 | Discharge: 2020-09-04 | Disposition: A | Discharge status: Home / Self Care | Payer: Medicare Other | Source: Ambulatory Visit | Attending: Radiation Oncology | Admitting: Radiation Oncology

## 2020-09-04 DIAGNOSIS — Z510 Encounter for antineoplastic radiation therapy: Secondary | ICD-10-CM | POA: Diagnosis not present

## 2020-09-07 ENCOUNTER — Other Ambulatory Visit: Payer: Self-pay

## 2020-09-07 ENCOUNTER — Ambulatory Visit
Admission: RE | Admit: 2020-09-07 | Discharge: 2020-09-07 | Disposition: A | Discharge status: Home / Self Care | Payer: Medicare Other | Source: Ambulatory Visit | Attending: Radiation Oncology | Admitting: Radiation Oncology

## 2020-09-07 DIAGNOSIS — Z510 Encounter for antineoplastic radiation therapy: Secondary | ICD-10-CM | POA: Diagnosis not present

## 2020-09-07 DIAGNOSIS — C61 Malignant neoplasm of prostate: Secondary | ICD-10-CM | POA: Insufficient documentation

## 2020-09-08 ENCOUNTER — Ambulatory Visit
Admission: RE | Admit: 2020-09-08 | Discharge: 2020-09-08 | Disposition: A | Discharge status: Home / Self Care | Payer: Medicare Other | Source: Ambulatory Visit | Attending: Radiation Oncology | Admitting: Radiation Oncology

## 2020-09-08 DIAGNOSIS — C61 Malignant neoplasm of prostate: Secondary | ICD-10-CM | POA: Diagnosis not present

## 2020-09-08 DIAGNOSIS — Z510 Encounter for antineoplastic radiation therapy: Secondary | ICD-10-CM | POA: Diagnosis not present

## 2020-09-09 ENCOUNTER — Ambulatory Visit
Admission: RE | Admit: 2020-09-09 | Discharge: 2020-09-09 | Disposition: A | Discharge status: Home / Self Care | Payer: Medicare Other | Source: Ambulatory Visit | Attending: Radiation Oncology | Admitting: Radiation Oncology

## 2020-09-09 DIAGNOSIS — C61 Malignant neoplasm of prostate: Secondary | ICD-10-CM | POA: Diagnosis not present

## 2020-09-09 DIAGNOSIS — Z510 Encounter for antineoplastic radiation therapy: Secondary | ICD-10-CM | POA: Diagnosis not present

## 2020-09-10 ENCOUNTER — Other Ambulatory Visit: Payer: Self-pay

## 2020-09-10 ENCOUNTER — Ambulatory Visit
Admission: RE | Admit: 2020-09-10 | Discharge: 2020-09-10 | Disposition: A | Discharge status: Home / Self Care | Payer: Medicare Other | Source: Ambulatory Visit | Attending: Radiation Oncology | Admitting: Radiation Oncology

## 2020-09-10 DIAGNOSIS — Z510 Encounter for antineoplastic radiation therapy: Secondary | ICD-10-CM | POA: Diagnosis not present

## 2020-09-10 DIAGNOSIS — C61 Malignant neoplasm of prostate: Secondary | ICD-10-CM | POA: Diagnosis not present

## 2020-09-11 ENCOUNTER — Ambulatory Visit
Admission: RE | Admit: 2020-09-11 | Discharge: 2020-09-11 | Disposition: A | Discharge status: Home / Self Care | Payer: Medicare Other | Source: Ambulatory Visit | Attending: Radiation Oncology | Admitting: Radiation Oncology

## 2020-09-11 DIAGNOSIS — Z510 Encounter for antineoplastic radiation therapy: Secondary | ICD-10-CM | POA: Diagnosis not present

## 2020-09-11 DIAGNOSIS — C61 Malignant neoplasm of prostate: Secondary | ICD-10-CM | POA: Diagnosis not present

## 2020-09-14 ENCOUNTER — Other Ambulatory Visit: Payer: Self-pay

## 2020-09-14 ENCOUNTER — Ambulatory Visit
Admission: RE | Admit: 2020-09-14 | Discharge: 2020-09-14 | Disposition: A | Discharge status: Home / Self Care | Payer: Medicare Other | Source: Ambulatory Visit | Attending: Radiation Oncology | Admitting: Radiation Oncology

## 2020-09-14 DIAGNOSIS — C61 Malignant neoplasm of prostate: Secondary | ICD-10-CM | POA: Diagnosis not present

## 2020-09-14 DIAGNOSIS — Z510 Encounter for antineoplastic radiation therapy: Secondary | ICD-10-CM | POA: Diagnosis not present

## 2020-09-15 ENCOUNTER — Ambulatory Visit
Admission: RE | Admit: 2020-09-15 | Discharge: 2020-09-15 | Disposition: A | Discharge status: Home / Self Care | Payer: Medicare Other | Source: Ambulatory Visit | Attending: Radiation Oncology | Admitting: Radiation Oncology

## 2020-09-15 DIAGNOSIS — Z510 Encounter for antineoplastic radiation therapy: Secondary | ICD-10-CM | POA: Diagnosis not present

## 2020-09-15 DIAGNOSIS — C61 Malignant neoplasm of prostate: Secondary | ICD-10-CM | POA: Diagnosis not present

## 2020-09-16 ENCOUNTER — Other Ambulatory Visit: Payer: Self-pay

## 2020-09-16 ENCOUNTER — Ambulatory Visit
Admission: RE | Admit: 2020-09-16 | Discharge: 2020-09-16 | Disposition: A | Discharge status: Home / Self Care | Payer: Medicare Other | Source: Ambulatory Visit | Attending: Radiation Oncology | Admitting: Radiation Oncology

## 2020-09-16 DIAGNOSIS — C61 Malignant neoplasm of prostate: Secondary | ICD-10-CM | POA: Diagnosis not present

## 2020-09-16 DIAGNOSIS — Z510 Encounter for antineoplastic radiation therapy: Secondary | ICD-10-CM | POA: Diagnosis not present

## 2020-09-17 ENCOUNTER — Telehealth: Payer: Self-pay

## 2020-09-17 ENCOUNTER — Ambulatory Visit
Admission: RE | Admit: 2020-09-17 | Discharge: 2020-09-17 | Disposition: A | Discharge status: Home / Self Care | Payer: Medicare Other | Source: Ambulatory Visit | Attending: Radiation Oncology | Admitting: Radiation Oncology

## 2020-09-17 DIAGNOSIS — Z510 Encounter for antineoplastic radiation therapy: Secondary | ICD-10-CM | POA: Diagnosis not present

## 2020-09-17 DIAGNOSIS — C61 Malignant neoplasm of prostate: Secondary | ICD-10-CM | POA: Diagnosis not present

## 2020-09-17 NOTE — Telephone Encounter (Signed)
Attempted to return pt call regarding his concern in blood pressure reading. No answer. VM left with direct number to reach this nurse.

## 2020-09-17 NOTE — Progress Notes (Signed)
Patient called clinic to report his blood pressure dropping to 80/50. BP returned to normal. Informed patient to monitor blood pressure and report to PCP if blood pressure does not get better. Patient instructed to not take second dose of metoprolol if BP is <110/80 per [employer], PA.

## 2020-09-18 ENCOUNTER — Other Ambulatory Visit: Payer: Self-pay

## 2020-09-18 ENCOUNTER — Ambulatory Visit
Admission: RE | Admit: 2020-09-18 | Discharge: 2020-09-18 | Disposition: A | Discharge status: Home / Self Care | Payer: Medicare Other | Source: Ambulatory Visit | Attending: Radiation Oncology | Admitting: Radiation Oncology

## 2020-09-18 DIAGNOSIS — Z510 Encounter for antineoplastic radiation therapy: Secondary | ICD-10-CM | POA: Diagnosis not present

## 2020-09-18 DIAGNOSIS — C61 Malignant neoplasm of prostate: Secondary | ICD-10-CM | POA: Diagnosis not present

## 2020-09-21 ENCOUNTER — Ambulatory Visit
Admission: RE | Admit: 2020-09-21 | Discharge: 2020-09-21 | Disposition: A | Discharge status: Home / Self Care | Payer: Medicare Other | Source: Ambulatory Visit | Attending: Radiation Oncology | Admitting: Radiation Oncology

## 2020-09-21 ENCOUNTER — Other Ambulatory Visit: Payer: Self-pay

## 2020-09-21 DIAGNOSIS — Z510 Encounter for antineoplastic radiation therapy: Secondary | ICD-10-CM | POA: Diagnosis not present

## 2020-09-21 DIAGNOSIS — C61 Malignant neoplasm of prostate: Secondary | ICD-10-CM | POA: Diagnosis not present

## 2020-09-22 ENCOUNTER — Ambulatory Visit
Admission: RE | Admit: 2020-09-22 | Discharge: 2020-09-22 | Disposition: A | Discharge status: Home / Self Care | Payer: Medicare Other | Source: Ambulatory Visit | Attending: Radiation Oncology | Admitting: Radiation Oncology

## 2020-09-22 DIAGNOSIS — Z510 Encounter for antineoplastic radiation therapy: Secondary | ICD-10-CM | POA: Diagnosis not present

## 2020-09-22 DIAGNOSIS — C61 Malignant neoplasm of prostate: Secondary | ICD-10-CM | POA: Diagnosis not present

## 2020-09-23 ENCOUNTER — Ambulatory Visit
Admission: RE | Admit: 2020-09-23 | Discharge: 2020-09-23 | Disposition: A | Discharge status: Home / Self Care | Payer: Medicare Other | Source: Ambulatory Visit | Attending: Radiation Oncology | Admitting: Radiation Oncology

## 2020-09-23 ENCOUNTER — Other Ambulatory Visit: Payer: Self-pay

## 2020-09-23 DIAGNOSIS — Z510 Encounter for antineoplastic radiation therapy: Secondary | ICD-10-CM | POA: Diagnosis not present

## 2020-09-23 DIAGNOSIS — C61 Malignant neoplasm of prostate: Secondary | ICD-10-CM | POA: Diagnosis not present

## 2020-09-24 ENCOUNTER — Ambulatory Visit
Admission: RE | Admit: 2020-09-24 | Discharge: 2020-09-24 | Disposition: A | Discharge status: Home / Self Care | Payer: Medicare Other | Source: Ambulatory Visit | Attending: Radiation Oncology | Admitting: Radiation Oncology

## 2020-09-24 DIAGNOSIS — C61 Malignant neoplasm of prostate: Secondary | ICD-10-CM | POA: Diagnosis not present

## 2020-09-24 DIAGNOSIS — Z510 Encounter for antineoplastic radiation therapy: Secondary | ICD-10-CM | POA: Diagnosis not present

## 2020-09-25 ENCOUNTER — Ambulatory Visit
Admission: RE | Admit: 2020-09-25 | Discharge: 2020-09-25 | Disposition: A | Discharge status: Home / Self Care | Payer: Medicare Other | Source: Ambulatory Visit | Attending: Radiation Oncology | Admitting: Radiation Oncology

## 2020-09-25 DIAGNOSIS — C61 Malignant neoplasm of prostate: Secondary | ICD-10-CM | POA: Diagnosis not present

## 2020-09-25 DIAGNOSIS — Z510 Encounter for antineoplastic radiation therapy: Secondary | ICD-10-CM | POA: Diagnosis not present

## 2020-09-28 ENCOUNTER — Other Ambulatory Visit: Payer: Self-pay

## 2020-09-28 ENCOUNTER — Ambulatory Visit
Admission: RE | Admit: 2020-09-28 | Discharge: 2020-09-28 | Disposition: A | Discharge status: Home / Self Care | Payer: Medicare Other | Source: Ambulatory Visit | Attending: Radiation Oncology | Admitting: Radiation Oncology

## 2020-09-28 DIAGNOSIS — Z510 Encounter for antineoplastic radiation therapy: Secondary | ICD-10-CM | POA: Diagnosis not present

## 2020-09-28 DIAGNOSIS — C61 Malignant neoplasm of prostate: Secondary | ICD-10-CM | POA: Diagnosis not present

## 2020-09-29 ENCOUNTER — Ambulatory Visit
Admission: RE | Admit: 2020-09-29 | Discharge: 2020-09-29 | Disposition: A | Discharge status: Home / Self Care | Payer: Medicare Other | Source: Ambulatory Visit | Attending: Radiation Oncology | Admitting: Radiation Oncology

## 2020-09-29 DIAGNOSIS — Z510 Encounter for antineoplastic radiation therapy: Secondary | ICD-10-CM | POA: Diagnosis not present

## 2020-09-29 DIAGNOSIS — C61 Malignant neoplasm of prostate: Secondary | ICD-10-CM | POA: Diagnosis not present

## 2020-09-30 ENCOUNTER — Other Ambulatory Visit: Payer: Self-pay

## 2020-09-30 ENCOUNTER — Ambulatory Visit
Admission: RE | Admit: 2020-09-30 | Discharge: 2020-09-30 | Disposition: A | Discharge status: Home / Self Care | Payer: Medicare Other | Source: Ambulatory Visit | Attending: Radiation Oncology | Admitting: Radiation Oncology

## 2020-09-30 DIAGNOSIS — C61 Malignant neoplasm of prostate: Secondary | ICD-10-CM | POA: Diagnosis not present

## 2020-09-30 DIAGNOSIS — Z510 Encounter for antineoplastic radiation therapy: Secondary | ICD-10-CM | POA: Diagnosis not present

## 2020-10-01 ENCOUNTER — Ambulatory Visit
Admission: RE | Admit: 2020-10-01 | Discharge: 2020-10-01 | Disposition: A | Discharge status: Home / Self Care | Payer: Medicare Other | Source: Ambulatory Visit | Attending: Radiation Oncology | Admitting: Radiation Oncology

## 2020-10-01 DIAGNOSIS — Z510 Encounter for antineoplastic radiation therapy: Secondary | ICD-10-CM | POA: Diagnosis not present

## 2020-10-01 DIAGNOSIS — C61 Malignant neoplasm of prostate: Secondary | ICD-10-CM | POA: Diagnosis not present

## 2020-10-02 ENCOUNTER — Ambulatory Visit
Admission: RE | Admit: 2020-10-02 | Discharge: 2020-10-02 | Disposition: A | Discharge status: Home / Self Care | Payer: Medicare Other | Source: Ambulatory Visit | Attending: Radiation Oncology | Admitting: Radiation Oncology

## 2020-10-02 ENCOUNTER — Other Ambulatory Visit: Payer: Self-pay

## 2020-10-02 DIAGNOSIS — Z510 Encounter for antineoplastic radiation therapy: Secondary | ICD-10-CM | POA: Diagnosis not present

## 2020-10-02 DIAGNOSIS — C61 Malignant neoplasm of prostate: Secondary | ICD-10-CM | POA: Diagnosis not present

## 2020-10-05 ENCOUNTER — Ambulatory Visit
Admission: RE | Admit: 2020-10-05 | Discharge: 2020-10-05 | Disposition: A | Discharge status: Home / Self Care | Payer: Medicare Other | Source: Ambulatory Visit | Attending: Radiation Oncology | Admitting: Radiation Oncology

## 2020-10-05 ENCOUNTER — Telehealth: Payer: Self-pay | Admitting: *Deleted

## 2020-10-05 ENCOUNTER — Encounter: Payer: Self-pay | Admitting: Urology

## 2020-10-05 ENCOUNTER — Other Ambulatory Visit: Payer: Self-pay

## 2020-10-05 DIAGNOSIS — C61 Malignant neoplasm of prostate: Secondary | ICD-10-CM | POA: Diagnosis not present

## 2020-10-05 DIAGNOSIS — Z510 Encounter for antineoplastic radiation therapy: Secondary | ICD-10-CM | POA: Diagnosis not present

## 2020-10-05 NOTE — Telephone Encounter (Signed)
RETURNED PATIENT'S WIFE'S PHONE CALL, LVM FOR A RETURN CALL 

## 2020-10-06 ENCOUNTER — Ambulatory Visit
Admission: RE | Admit: 2020-10-06 | Discharge: 2020-10-06 | Disposition: A | Discharge status: Home / Self Care | Payer: Medicare Other | Source: Ambulatory Visit | Attending: Radiation Oncology | Admitting: Radiation Oncology

## 2020-10-06 DIAGNOSIS — C61 Malignant neoplasm of prostate: Secondary | ICD-10-CM | POA: Diagnosis not present

## 2020-10-06 DIAGNOSIS — Z510 Encounter for antineoplastic radiation therapy: Secondary | ICD-10-CM | POA: Diagnosis not present

## 2020-10-09 ENCOUNTER — Telehealth: Payer: Self-pay | Admitting: *Deleted

## 2020-10-09 ENCOUNTER — Encounter: Payer: Self-pay | Admitting: Radiation Oncology

## 2020-10-09 ENCOUNTER — Ambulatory Visit
Admission: RE | Admit: 2020-10-09 | Discharge: 2020-10-09 | Disposition: A | Discharge status: Home / Self Care | Payer: Medicare Other | Source: Ambulatory Visit | Attending: Radiation Oncology | Admitting: Radiation Oncology

## 2020-10-09 DIAGNOSIS — E785 Hyperlipidemia, unspecified: Secondary | ICD-10-CM | POA: Diagnosis not present

## 2020-10-09 DIAGNOSIS — I712 Thoracic aortic aneurysm, without rupture: Secondary | ICD-10-CM | POA: Diagnosis not present

## 2020-10-09 DIAGNOSIS — C61 Malignant neoplasm of prostate: Secondary | ICD-10-CM | POA: Insufficient documentation

## 2020-10-09 DIAGNOSIS — Z23 Encounter for immunization: Secondary | ICD-10-CM | POA: Diagnosis not present

## 2020-10-09 DIAGNOSIS — N183 Chronic kidney disease, stage 3 unspecified: Secondary | ICD-10-CM | POA: Diagnosis not present

## 2020-10-09 DIAGNOSIS — K219 Gastro-esophageal reflux disease without esophagitis: Secondary | ICD-10-CM | POA: Diagnosis not present

## 2020-10-09 DIAGNOSIS — Z0000 Encounter for general adult medical examination without abnormal findings: Secondary | ICD-10-CM | POA: Diagnosis not present

## 2020-10-09 DIAGNOSIS — I10 Essential (primary) hypertension: Secondary | ICD-10-CM | POA: Diagnosis not present

## 2020-10-09 DIAGNOSIS — E1159 Type 2 diabetes mellitus with other circulatory complications: Secondary | ICD-10-CM | POA: Diagnosis not present

## 2020-10-09 NOTE — Telephone Encounter (Signed)
RETURNED PATIENT'S WIFE'S PHONE CALL, SPOKE WITH PATIENT'S WIFE- [name] Dacey

## 2020-10-12 NOTE — Progress Notes (Signed)
  Radiation Oncology         [phone] ________________________________  Name: Taurean Babicz MRN: [id number]  Date: 10/09/2020  DOB: 10-Apr-1945  3D Planning Note   Prostate Brachytherapy Post-Implant Dosimetry  Diagnosis: 75 y.o. gentleman with Stage T1c adenocarcinoma of the prostate with Gleason score of 4+4, and PSA of 9.77.  Narrative: On a previous date, Andray Afshar returned following prostate seed implantation for post implant planning. He underwent CT scan complex simulation to delineate the three-dimensional structures of the pelvis and demonstrate the radiation distribution.  Since that time, the seed localization, and complex isodose planning with dose volume histograms have now been completed.  Results:   Prostate Coverage - The dose of radiation delivered to the 90% or more of the prostate gland (D90) was 102.87% of the prescription dose. This exceeds our goal of greater than 90%. Rectal Sparing - The volume of rectal tissue receiving the prescription dose or higher was 0.0 cc. This falls under our thresholds tolerance of 1.0 cc.  Impression: The prostate seed implant appears to show adequate target coverage and appropriate rectal sparing.  Plan:  The patient will continue to follow with urology for ongoing PSA determinations. I would anticipate a high likelihood for local tumor control with minimal risk for rectal morbidity.  ________________________________  Sheral Apley Tammi Klippel, M.D.

## 2020-10-21 ENCOUNTER — Telehealth: Payer: Self-pay | Admitting: *Deleted

## 2020-10-21 NOTE — Telephone Encounter (Signed)
Returned patient's phone call, spoke with patient 

## 2020-11-17 DIAGNOSIS — R350 Frequency of micturition: Secondary | ICD-10-CM | POA: Diagnosis not present

## 2020-11-18 ENCOUNTER — Telehealth: Payer: Self-pay

## 2020-11-18 ENCOUNTER — Encounter: Payer: Self-pay | Admitting: Urology

## 2020-11-18 NOTE — Progress Notes (Signed)
Patient reports polyuria, fatigue, nocturia x5, and bilateral leg pains 4/10. No other symptoms reported at this time.  No urinary management medications taken at this time and no urology follow-up scheduled as of yet- per patient.  I-PSS Score of 9 (moderate). Meaningful [place] complete.  Patient notified of 9:30am-11/19/20 telephone appointment and verbalized understanding.

## 2020-11-18 NOTE — Telephone Encounter (Signed)
Franklin Hall called thought he had blood work scheduled yesterday and when he came in he wasn't able to see anyone.  No labwork was scheduled at this facility for Franklin Hall he was told this and that he should follow-up with his PCP.  Nothing follows.

## 2020-11-19 ENCOUNTER — Other Ambulatory Visit: Payer: Self-pay | Admitting: Urology

## 2020-11-19 ENCOUNTER — Ambulatory Visit
Admission: RE | Admit: 2020-11-19 | Discharge: 2020-11-19 | Disposition: A | Discharge status: Home / Self Care | Payer: Medicare Other | Source: Ambulatory Visit | Attending: Urology | Admitting: Urology

## 2020-11-19 DIAGNOSIS — C61 Malignant neoplasm of prostate: Secondary | ICD-10-CM

## 2020-11-19 MED ORDER — GABAPENTIN 300 MG PO CAPS: 300.0000 mg | ORAL_CAPSULE | Freq: Three times a day (TID) | ORAL | 1 refills | Status: AC

## 2020-11-19 NOTE — Progress Notes (Signed)
Radiation Oncology         [phone] ________________________________  Name: Franklin Hall MRN: [id number]  Date: 11/19/2020  DOB: 04/09/1945  Post Treatment Note  CC: [name], MD  [name], MD  Diagnosis:   75 y.o. gentleman with Stage T1c adenocarcinoma of the prostate with Gleason score of 4+4, and PSA of 9.77.     Interval Since Last Radiation:  6.5 weeks   08/03/20: Insertion of radioactive I-125 seeds into the prostate gland;110 Gy, boost therapy.   08/28/20 - 10/06/20: The prostate, seminal vesicles, and pelvic lymph nodes were treated to 45 Gy in 25 fractions of 1.8 Gy, to supplement an up-front prostate seed implant boost of 110 Gy to achieve a total nominal dose of 155 Gy   Narrative:  I spoke with the patient to conduct his routine scheduled 1 month follow up visit via telephone to spare the patient unnecessary potential exposure in the healthcare setting during the current COVID-19 pandemic.  The patient was notified in advance and gave permission to proceed with this visit format.  He tolerated radiation treatment relatively well with only minor urinary irritation and modest fatigue.  He reported dysuria, and nocturia 3-6 times per night on Flomax.  He specifically denied gross hematuria, straining to void, incomplete emptying or incontinence.  He did have some occasional diarrhea.                                On review of systems, the patient states that he is doing well in general.  He has noted gradual improvement in his LUTS and continues to tolerate the ADT fairly well.  He had a follow-up with [name], NP on 11/17/2020 and received his second 48-month Eligard injection at that time.  However, they did not check a PSA/testosterone.  He has continued with increased urinary daytime frequency and nocturia 4-5 times per night but specifically denies dysuria, gross hematuria, straining to void, incomplete bladder emptying or incontinence.  He denies any  abdominal pain, nausea, vomiting, diarrhea or constipation.  He reports intact appetite and is maintaining his weight.  He does have some persistent fatigue associated with the ADT but feels that this is gradually improving.  Since completion of his treatment, he has noticed intermittent pins-and-needles/shooting pains in his groin radiating down to mid thigh.  This does not seem to be associated with any specific activity and resolves spontaneously.  Otherwise, he is without complaints and overall, is pleased with his progress to date.  ALLERGIES:  has No Known Allergies.  Meds: Current Outpatient Medications  Medication Sig Dispense Refill   Calcium Citrate-Vitamin D (CALCIUM + D PO) Take by mouth daily.     cetirizine (ZYRTEC) 10 MG chewable tablet Chew 10 mg by mouth daily.     Cholecalciferol (VITAMIN D-3 PO) Take by mouth daily.     docusate sodium (COLACE) 100 MG capsule Take 1 capsule (100 mg total) by mouth daily as needed for up to 30 doses. 30 capsule 0   gabapentin (NEURONTIN) 300 MG capsule Take 1 capsule (300 mg total) by mouth 3 (three) times daily. 90 capsule 1   lisinopril-hydrochlorothiazide (PRINZIDE,ZESTORETIC) 20-12.5 MG per tablet Take 1 tablet by mouth daily.     metoprolol (LOPRESSOR) 100 MG tablet Take 100 mg by mouth 2 (two) times daily.     omeprazole (PRILOSEC) 20 MG capsule Take 20 mg by mouth at bedtime.  OVER THE COUNTER MEDICATION Take 1 tablet by mouth daily. BEET ROOT     simvastatin (ZOCOR) 20 MG tablet Take 20 mg by mouth at bedtime.     tamsulosin (FLOMAX) 0.4 MG CAPS capsule Take 0.4 mg by mouth at bedtime.     No current facility-administered medications for this encounter.    Physical Findings:  vitals were not taken for this visit.  Pain Assessment Pain Score: 4  (legs)/10 Unable to assess due to telephone follow-up visit format.  Lab Findings: Lab Results  Component Value Date   WBC 5.7 07/30/2020   HGB 12.6 (L) 07/30/2020   HCT 39.3  07/30/2020   MCV 84.9 07/30/2020   PLT 165 07/30/2020     Radiographic Findings: No results found.  Impression/Plan: 1. 75 y.o. gentleman with Stage T1c adenocarcinoma of the prostate with Gleason score of 4+4, and PSA of 9.77.    He will continue to follow up with urology for ongoing PSA determinations.  At the time of his recent follow-up visit with [name], NP, he did receive his second Eligard ADT injection but did not have previsit labs nor did they draw a PSA and/or testosterone at the time because he was told that they did not have any orders in the system.  He has an appointment scheduled for labs on 05/11/2021 and will see Dr. Abner Greenspan the following week but would like to have his PSA checked in the near future, prior to that time. He understands what to expect with regards to PSA monitoring going forward.  Regarding the shooting pains in his legs, I am not sure of the root cause, as this would be an unusual side effect of radiation, but we will try a trial of Neurontin to see if this helps.  I did advise that if the symptoms persist or worsen despite this treatment, he should contact his PCP for further evaluation and management.  I will look forward to following his response to treatment via correspondence with urology, and would be happy to continue to participate in his care if clinically indicated. I talked to the patient about what to expect in the future, including his risk for erectile dysfunction and rectal bleeding. I encouraged him to call or return to the office if he has any questions regarding his previous radiation or possible radiation side effects. He was comfortable with this plan and will follow up as needed.     [name], PA-C

## 2020-11-19 NOTE — Progress Notes (Signed)
  Radiation Oncology         [phone] ________________________________  Name: Franklin Hall MRN: [id number]  Date: 10/05/2020  DOB: [birth year]  End of Treatment Note  Diagnosis:   75 y.o. gentleman with Stage T1c adenocarcinoma of the prostate with Gleason score of 4+4, and PSA of 9.77.     Indication for treatment:  Curative, Definitive Radiotherapy       Radiation treatment dates:     08/03/20: Brachytherapy boost  08/28/20 - 10/06/20: EBRT prostate and pelvic nodes  Site/dose:  1. Insertion of radioactive I-125 seeds into the prostate gland;110 Gy, boost therapy.  2. The prostate, seminal vesicles, and pelvic lymph nodes were treated to 45 Gy in 25 fractions of 1.8 Gy, to supplement an up-front prostate seed implant boost of 110 Gy to achieve a total nominal dose of 155 Gy  Beams/energy:  1. A total of 15 needles were used to deposit 59 seeds in the prostate gland. The individual seed activity was 0.243 mCi.   2. The prostate, seminal vesicles, and pelvic lymph nodes were treated using VMAT intensity modulated radiotherapy delivering 6 megavolt photons. Image guidance was performed with CB-CT studies prior to each fraction. He was immobilized with a body fix lower extremity mold.   Narrative: The patient tolerated radiation treatment relatively well with only minor urinary irritation and modest fatigue.  He reported dysuria, and nocturia 3-6 times per night on Flomax.  He specifically denied gross hematuria, straining to void, incomplete emptying or incontinence.  He did have some occasional diarrhea.  Plan: The patient has completed radiation treatment. He will return to radiation oncology clinic for routine followup in one month. I advised him to call or return sooner if he has any questions or concerns related to his recovery or treatment. ________________________________  Sheral Apley. Tammi Klippel, M.D.

## 2020-11-20 ENCOUNTER — Other Ambulatory Visit: Payer: Self-pay | Admitting: Urology

## 2020-11-20 DIAGNOSIS — C61 Malignant neoplasm of prostate: Secondary | ICD-10-CM

## 2020-11-24 ENCOUNTER — Telehealth: Payer: Self-pay

## 2020-11-24 NOTE — Telephone Encounter (Signed)
Franklin Hall called needed number and direction to the urologist appointment tomorrow.  Thought his appointment was at the cancer center.  Nothing else follows.

## 2020-11-30 ENCOUNTER — Telehealth: Payer: Self-pay

## 2020-11-30 NOTE — Telephone Encounter (Signed)
Tried to return call with Mrs. Carbone was unable to reach to get clarification on medications.  Will attempt to call again later.

## 2020-12-01 ENCOUNTER — Telehealth: Payer: Self-pay

## 2020-12-01 NOTE — Telephone Encounter (Signed)
Returned call had to leave message.  Nothing else follows.

## 2020-12-04 ENCOUNTER — Telehealth: Payer: Self-pay | Admitting: *Deleted

## 2020-12-07 ENCOUNTER — Telehealth: Payer: Self-pay | Admitting: *Deleted

## 2020-12-16 DIAGNOSIS — R3912 Poor urinary stream: Secondary | ICD-10-CM | POA: Diagnosis not present

## 2020-12-16 DIAGNOSIS — R3915 Urgency of urination: Secondary | ICD-10-CM | POA: Diagnosis not present

## 2020-12-24 ENCOUNTER — Inpatient Hospital Stay: Payer: Medicare Other | Attending: Adult Health | Admitting: *Deleted

## 2020-12-24 ENCOUNTER — Other Ambulatory Visit: Payer: Self-pay

## 2020-12-24 ENCOUNTER — Telehealth: Payer: Self-pay | Admitting: *Deleted

## 2020-12-24 DIAGNOSIS — E860 Dehydration: Secondary | ICD-10-CM | POA: Insufficient documentation

## 2020-12-24 DIAGNOSIS — R42 Dizziness and giddiness: Secondary | ICD-10-CM | POA: Insufficient documentation

## 2020-12-24 DIAGNOSIS — I951 Orthostatic hypotension: Secondary | ICD-10-CM | POA: Diagnosis not present

## 2020-12-24 DIAGNOSIS — I10 Essential (primary) hypertension: Secondary | ICD-10-CM | POA: Insufficient documentation

## 2020-12-30 DIAGNOSIS — I10 Essential (primary) hypertension: Secondary | ICD-10-CM | POA: Diagnosis not present

## 2021-01-22 DIAGNOSIS — I10 Essential (primary) hypertension: Secondary | ICD-10-CM | POA: Diagnosis not present

## 2021-01-22 DIAGNOSIS — D649 Anemia, unspecified: Secondary | ICD-10-CM | POA: Diagnosis not present

## 2021-01-22 DIAGNOSIS — N183 Chronic kidney disease, stage 3 unspecified: Secondary | ICD-10-CM | POA: Diagnosis not present

## 2021-01-22 DIAGNOSIS — E1159 Type 2 diabetes mellitus with other circulatory complications: Secondary | ICD-10-CM | POA: Diagnosis not present

## 2021-02-11 ENCOUNTER — Telehealth: Payer: Self-pay | Admitting: *Deleted

## 2021-02-11 NOTE — Telephone Encounter (Signed)
RETURNED PATIENT'S PHONE CALL, SPOKE WITH PATIENT. ?

## 2021-04-01 DIAGNOSIS — I10 Essential (primary) hypertension: Secondary | ICD-10-CM | POA: Diagnosis not present

## 2021-04-15 NOTE — Progress Notes (Signed)
Patient is scheduled for PSA check @ [name] on 4/4, and will follow up with Dr. Abner Greenspan on 4/11.   Patient aware.  ?

## 2021-11-15 DIAGNOSIS — Z23 Encounter for immunization: Secondary | ICD-10-CM | POA: Diagnosis not present

## 2021-11-15 DIAGNOSIS — I10 Essential (primary) hypertension: Secondary | ICD-10-CM | POA: Diagnosis not present

## 2021-11-15 DIAGNOSIS — E1159 Type 2 diabetes mellitus with other circulatory complications: Secondary | ICD-10-CM | POA: Diagnosis not present

## 2021-11-15 DIAGNOSIS — Z0000 Encounter for general adult medical examination without abnormal findings: Secondary | ICD-10-CM | POA: Diagnosis not present

## 2021-11-15 DIAGNOSIS — E785 Hyperlipidemia, unspecified: Secondary | ICD-10-CM | POA: Diagnosis not present

## 2021-11-15 DIAGNOSIS — K219 Gastro-esophageal reflux disease without esophagitis: Secondary | ICD-10-CM | POA: Diagnosis not present

## 2021-12-02 ENCOUNTER — Telehealth: Payer: Self-pay

## 2021-12-02 NOTE — Telephone Encounter (Signed)
Patient called wanted to know what he should do about burning with urination.  Denies trying AZO.  Advised patient to reach out to his urologist since he has completed radiation a year ago.  Denies hematuria, chills , and fever.  Mr. Franklin Hall verbalized understanding and said he would call [name] office with [name] to follow up.

## 2021-12-06 DIAGNOSIS — M7989 Other specified soft tissue disorders: Secondary | ICD-10-CM | POA: Diagnosis not present

## 2021-12-06 DIAGNOSIS — I10 Essential (primary) hypertension: Secondary | ICD-10-CM | POA: Diagnosis not present

## 2021-12-16 DIAGNOSIS — R300 Dysuria: Secondary | ICD-10-CM | POA: Diagnosis not present

## 2021-12-21 DIAGNOSIS — Z5111 Encounter for antineoplastic chemotherapy: Secondary | ICD-10-CM | POA: Diagnosis not present

## 2022-03-09 DIAGNOSIS — U071 COVID-19: Secondary | ICD-10-CM | POA: Diagnosis not present

## 2022-03-24 ENCOUNTER — Other Ambulatory Visit: Payer: Self-pay | Admitting: Family Medicine

## 2022-03-24 DIAGNOSIS — K219 Gastro-esophageal reflux disease without esophagitis: Secondary | ICD-10-CM | POA: Diagnosis not present

## 2022-03-24 DIAGNOSIS — I7121 Aneurysm of the ascending aorta, without rupture: Secondary | ICD-10-CM | POA: Diagnosis not present

## 2022-03-24 DIAGNOSIS — C61 Malignant neoplasm of prostate: Secondary | ICD-10-CM | POA: Diagnosis not present

## 2022-03-24 DIAGNOSIS — I10 Essential (primary) hypertension: Secondary | ICD-10-CM | POA: Diagnosis not present

## 2022-03-24 DIAGNOSIS — E785 Hyperlipidemia, unspecified: Secondary | ICD-10-CM | POA: Diagnosis not present

## 2022-03-24 DIAGNOSIS — E1159 Type 2 diabetes mellitus with other circulatory complications: Secondary | ICD-10-CM | POA: Diagnosis not present

## 2022-03-31 ENCOUNTER — Ambulatory Visit
Admission: RE | Admit: 2022-03-31 | Discharge: 2022-03-31 | Disposition: A | Discharge status: Home / Self Care | Payer: No Typology Code available for payment source | Source: Ambulatory Visit | Attending: Family Medicine | Admitting: Family Medicine

## 2022-03-31 DIAGNOSIS — I7121 Aneurysm of the ascending aorta, without rupture: Secondary | ICD-10-CM

## 2022-03-31 DIAGNOSIS — Z8546 Personal history of malignant neoplasm of prostate: Secondary | ICD-10-CM | POA: Diagnosis not present

## 2022-03-31 DIAGNOSIS — I779 Disorder of arteries and arterioles, unspecified: Secondary | ICD-10-CM | POA: Diagnosis not present

## 2022-03-31 DIAGNOSIS — J9811 Atelectasis: Secondary | ICD-10-CM | POA: Diagnosis not present

## 2022-03-31 DIAGNOSIS — I359 Nonrheumatic aortic valve disorder, unspecified: Secondary | ICD-10-CM | POA: Diagnosis not present

## 2022-03-31 MED ORDER — IOPAMIDOL (ISOVUE-370) INJECTION 76%
75.0000 mL | Freq: Once | INTRAVENOUS | Status: AC | PRN
  Administered 2022-03-31: 75 mL via INTRAVENOUS

## 2022-05-09 DIAGNOSIS — J069 Acute upper respiratory infection, unspecified: Secondary | ICD-10-CM | POA: Diagnosis not present

## 2022-05-09 DIAGNOSIS — Z6832 Body mass index (BMI) 32.0-32.9, adult: Secondary | ICD-10-CM | POA: Diagnosis not present

## 2022-05-09 DIAGNOSIS — E669 Obesity, unspecified: Secondary | ICD-10-CM | POA: Diagnosis not present

## 2022-05-09 DIAGNOSIS — H6690 Otitis media, unspecified, unspecified ear: Secondary | ICD-10-CM | POA: Diagnosis not present

## 2022-05-12 DIAGNOSIS — C61 Malignant neoplasm of prostate: Secondary | ICD-10-CM | POA: Diagnosis not present

## 2022-05-12 DIAGNOSIS — F439 Reaction to severe stress, unspecified: Secondary | ICD-10-CM | POA: Diagnosis not present

## 2022-05-30 DIAGNOSIS — I10 Essential (primary) hypertension: Secondary | ICD-10-CM | POA: Diagnosis not present

## 2022-05-30 DIAGNOSIS — F439 Reaction to severe stress, unspecified: Secondary | ICD-10-CM | POA: Diagnosis not present

## 2022-05-30 DIAGNOSIS — I7121 Aneurysm of the ascending aorta, without rupture: Secondary | ICD-10-CM | POA: Diagnosis not present

## 2022-05-30 DIAGNOSIS — E119 Type 2 diabetes mellitus without complications: Secondary | ICD-10-CM | POA: Diagnosis not present

## 2022-09-08 DIAGNOSIS — E7800 Pure hypercholesterolemia, unspecified: Secondary | ICD-10-CM | POA: Insufficient documentation

## 2022-09-22 DIAGNOSIS — C61 Malignant neoplasm of prostate: Secondary | ICD-10-CM | POA: Diagnosis not present

## 2022-09-22 DIAGNOSIS — I7121 Aneurysm of the ascending aorta, without rupture: Secondary | ICD-10-CM | POA: Diagnosis not present

## 2022-09-22 DIAGNOSIS — I10 Essential (primary) hypertension: Secondary | ICD-10-CM | POA: Diagnosis not present

## 2022-09-22 DIAGNOSIS — E785 Hyperlipidemia, unspecified: Secondary | ICD-10-CM | POA: Diagnosis not present

## 2022-09-22 DIAGNOSIS — K219 Gastro-esophageal reflux disease without esophagitis: Secondary | ICD-10-CM | POA: Diagnosis not present

## 2022-09-22 DIAGNOSIS — E1159 Type 2 diabetes mellitus with other circulatory complications: Secondary | ICD-10-CM | POA: Diagnosis not present

## 2022-10-05 IMAGING — NM NM BONE WHOLE BODY
2 series · 2 of 2 positions shown · non-contrast
Comparison: Abdominal/pelvic CT scan from 6555.

CLINICAL DATA: Prostate cancer.

EXAM:
NUCLEAR MEDICINE WHOLE BODY BONE SCAN
TECHNIQUE: Whole body anterior and posterior images were obtained approximately
3 hours after intravenous injection of radiopharmaceutical.
RADIOPHARMACEUTICALS:  19.7 mCi Jechnetium-66m MDP IV

[Series 1: wbr_bone_40 whole body · 2.66mm/px · 1 of 1 slices shown (1 of 2)]
[im 1/1]
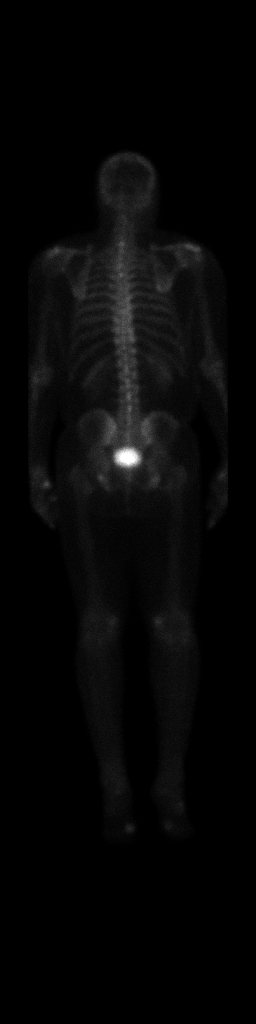

[Series 1: wbr_bone_40 whole body · 2.66mm/px · 1 of 1 slices shown (2 of 2)]
[im 1/1]
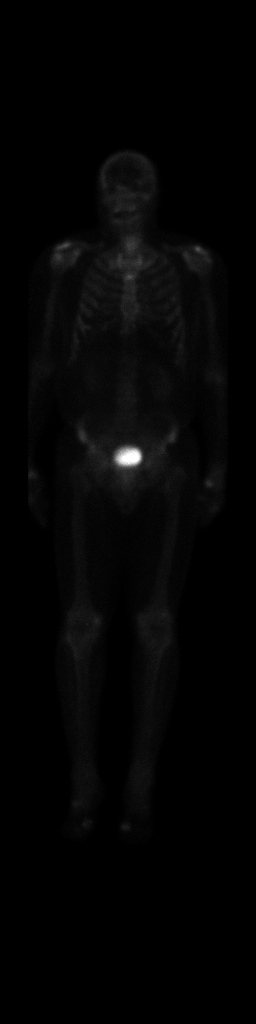

[2 of 2 positions shown; findings below may reference images not displayed]

FINDINGS: Areas of moderate degenerative type uptake noted around both
shoulders, at the sternoclavicular joints, both knees and both feet.

Focus of uptake noted near the sternal manubrial joint could be
degenerative change but could not exclude bone lesion. There is
fairly symmetric uptake in the ischial regions bilaterally. Based on
the prior CT scan from 6555 there are moderate enthesophytes
associated with the hamstring attachments and this likely accounts
for the bone scan findings.

No significant findings involving the spine, ribs or extremities.
IMPRESSION: 1. Focus of uptake noted near the sternomanubrial joint is an
indeterminate finding. MRI of the sternum may be helpful for further
evaluation.
2. Uptake in the ischial regions of the pelvis bilaterally likely
due to enthesophytes noted on remote CT scan.

## 2022-10-17 DIAGNOSIS — C61 Malignant neoplasm of prostate: Secondary | ICD-10-CM | POA: Diagnosis not present

## 2022-10-17 DIAGNOSIS — N5201 Erectile dysfunction due to arterial insufficiency: Secondary | ICD-10-CM | POA: Diagnosis not present

## 2022-10-17 DIAGNOSIS — N401 Enlarged prostate with lower urinary tract symptoms: Secondary | ICD-10-CM | POA: Diagnosis not present

## 2022-10-17 DIAGNOSIS — R3915 Urgency of urination: Secondary | ICD-10-CM | POA: Diagnosis not present

## 2022-12-29 IMAGING — DX DG CHEST 2V
2 series · 2 of 2 positions shown · non-contrast
Comparison: None.

CLINICAL DATA: 75-year-old male with a history of preoperative
chest x-ray

EXAM:
CHEST - 2 VIEW

[chest pa]
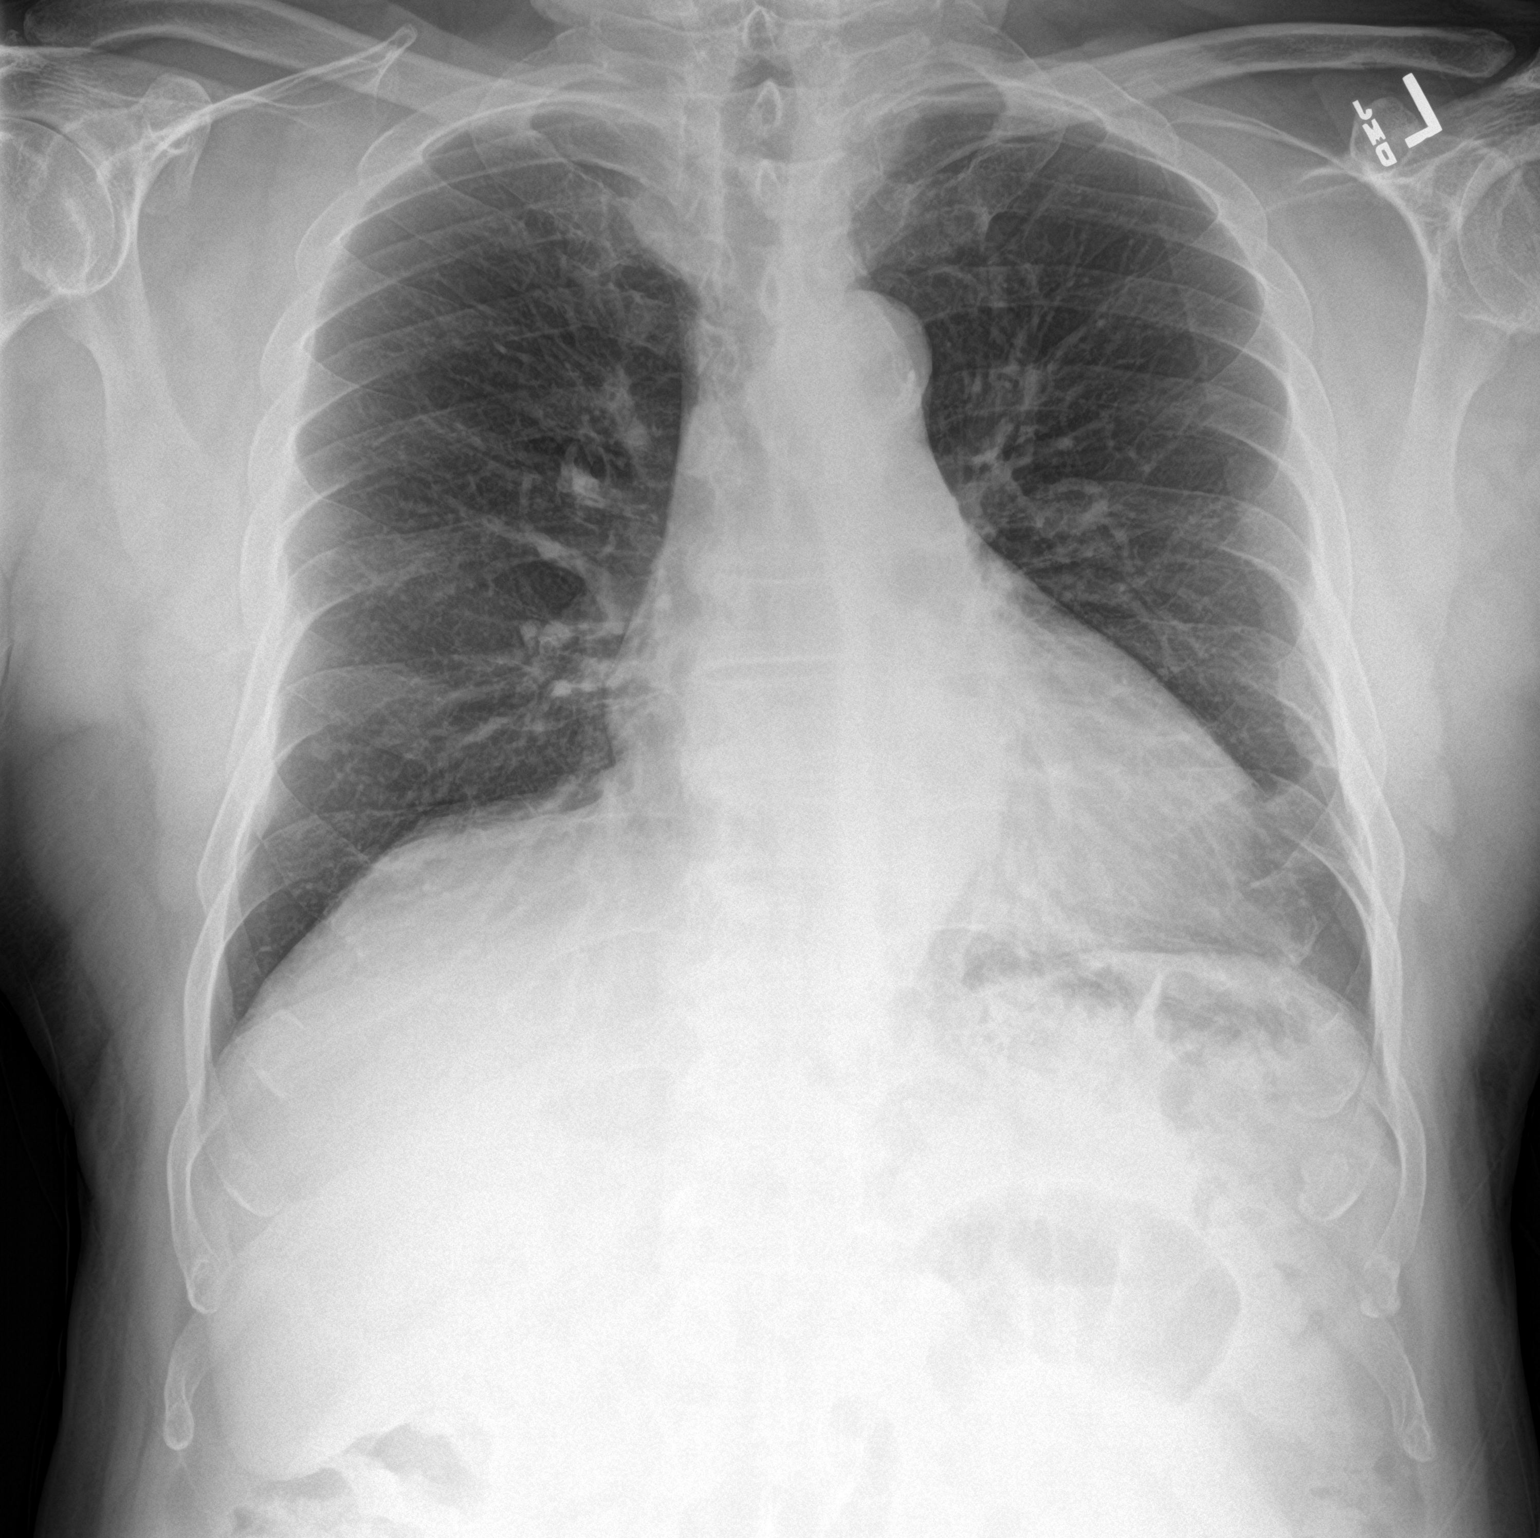

[chest lat]
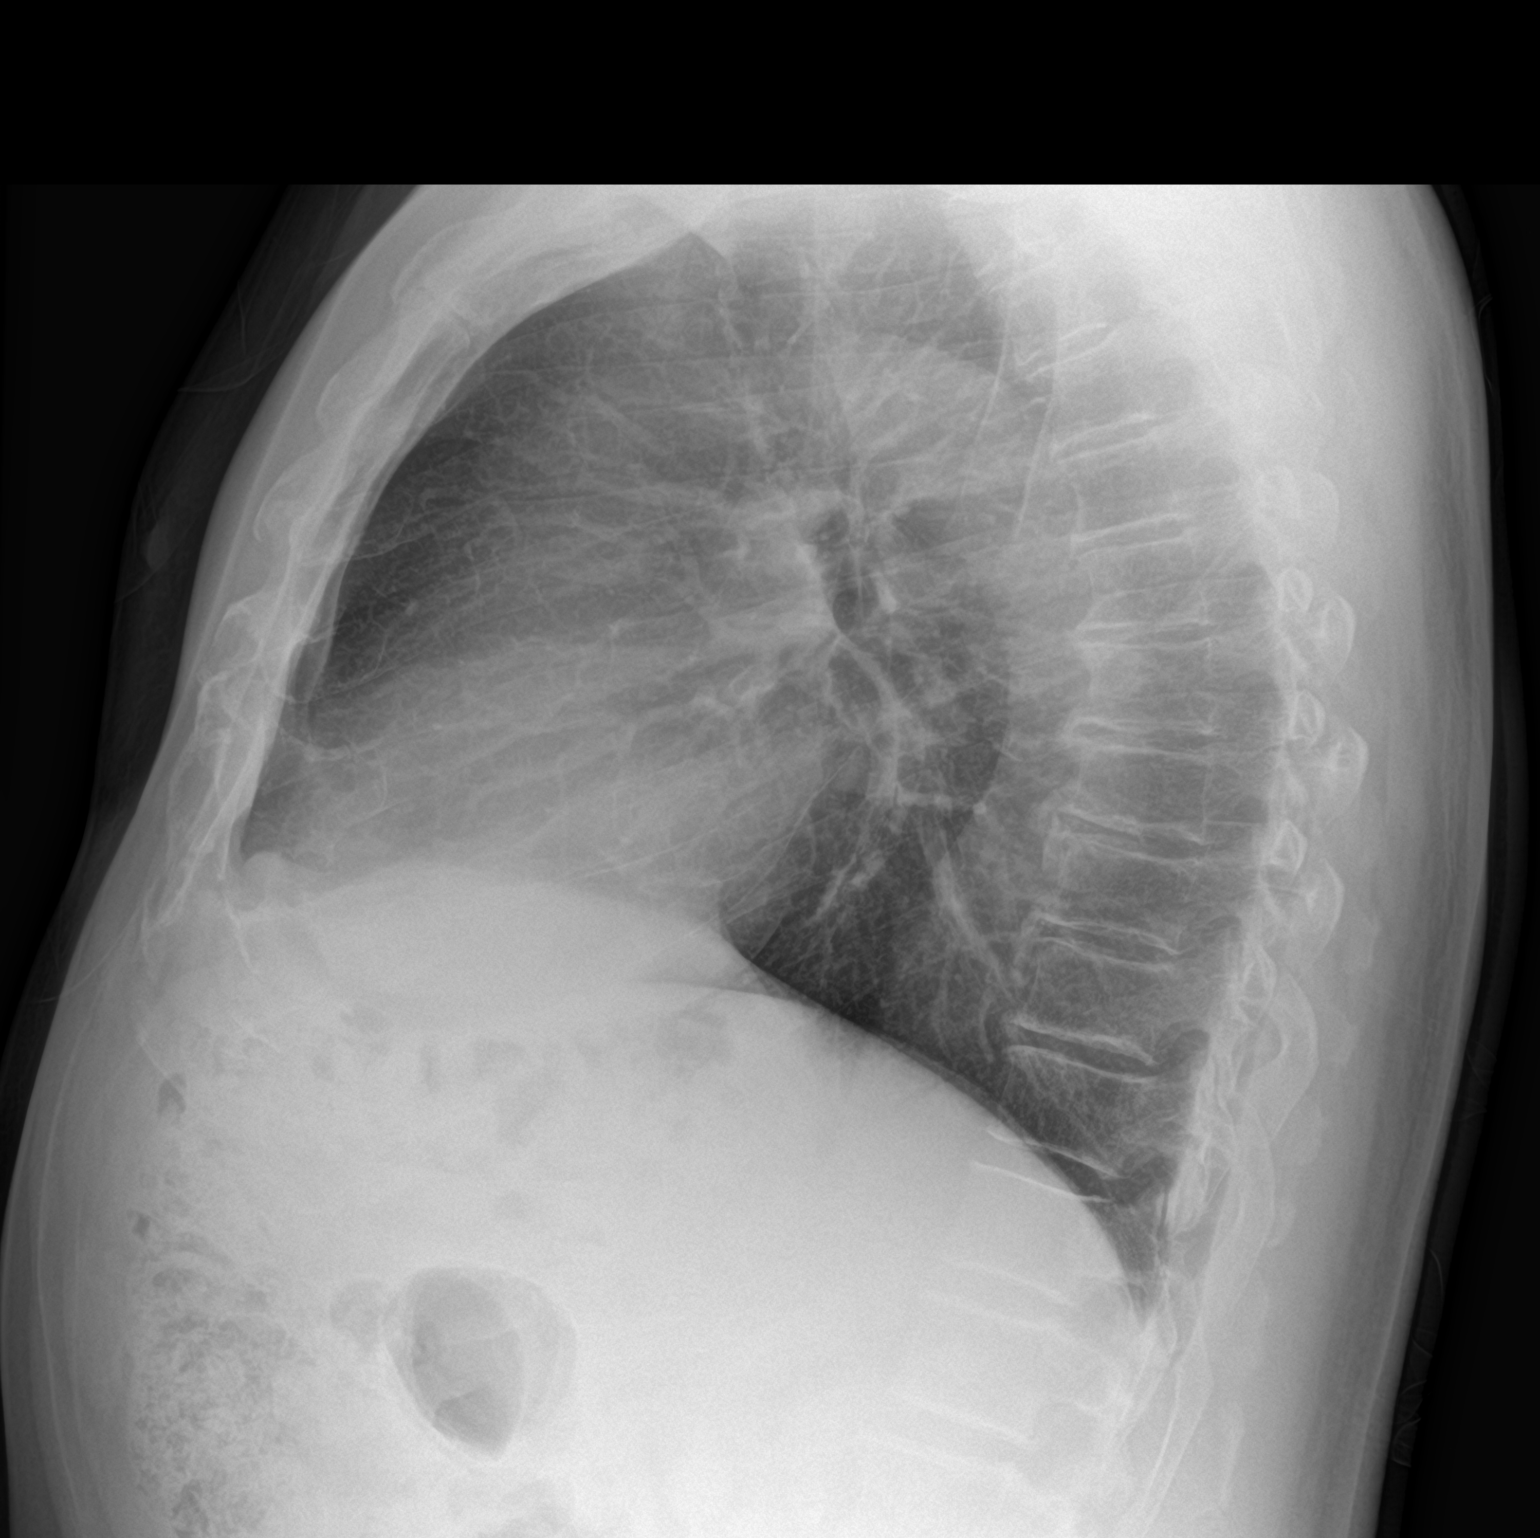

[2 of 2 positions shown; findings below may reference images not displayed]

FINDINGS: Cardiomediastinal silhouette within normal limits in size and
contour. No pneumothorax. No pleural effusion. No confluent airspace
disease. Degenerative changes of the spine. No acute displaced
fracture
IMPRESSION: Negative for acute cardiopulmonary disease

## 2023-01-18 DIAGNOSIS — Z0000 Encounter for general adult medical examination without abnormal findings: Secondary | ICD-10-CM | POA: Diagnosis not present

## 2023-01-18 DIAGNOSIS — Z23 Encounter for immunization: Secondary | ICD-10-CM | POA: Diagnosis not present

## 2023-01-18 DIAGNOSIS — I10 Essential (primary) hypertension: Secondary | ICD-10-CM | POA: Diagnosis not present

## 2023-01-18 DIAGNOSIS — E1165 Type 2 diabetes mellitus with hyperglycemia: Secondary | ICD-10-CM | POA: Diagnosis not present

## 2023-02-21 ENCOUNTER — Other Ambulatory Visit (HOSPITAL_COMMUNITY): Payer: Self-pay

## 2023-02-22 ENCOUNTER — Other Ambulatory Visit (HOSPITAL_COMMUNITY): Payer: Self-pay

## 2023-02-22 ENCOUNTER — Other Ambulatory Visit: Payer: Self-pay

## 2023-02-22 MED ORDER — SILDENAFIL CITRATE 100 MG PO TABS: 100.0000 mg | ORAL_TABLET | Freq: Every day | ORAL | 1 refills | Status: AC

## 2023-02-22 MED ORDER — METOPROLOL TARTRATE 25 MG PO TABS
25.0000 mg | ORAL_TABLET | Freq: Two times a day (BID) | ORAL | 2 refills | Status: AC
  Filled 2023-02-22: qty 200, 100d supply, fill #0

## 2023-02-22 MED ORDER — GABAPENTIN 100 MG PO CAPS
100.0000 mg | ORAL_CAPSULE | Freq: Every day | ORAL | 11 refills | Status: AC
  Filled 2023-02-22: qty 30, 30d supply, fill #0
  Filled 2023-04-22: qty 30, 30d supply, fill #1
  Filled 2023-05-29 (×2): qty 30, 30d supply, fill #2
  Filled 2023-07-09: qty 30, 30d supply, fill #3
  Filled 2023-08-07: qty 30, 30d supply, fill #4
  Filled 2023-09-05: qty 30, 30d supply, fill #5
  Filled 2023-10-16: qty 30, 30d supply, fill #6

## 2023-02-22 MED ORDER — LISINOPRIL-HYDROCHLOROTHIAZIDE 20-12.5 MG PO TABS
1.0000 | ORAL_TABLET | Freq: Two times a day (BID) | ORAL | 1 refills | Status: AC
  Filled 2023-02-22 (×2): qty 200, 100d supply, fill #0
  Filled 2023-07-09: qty 200, 100d supply, fill #1

## 2023-02-22 MED ORDER — FLUOXETINE HCL 10 MG PO CAPS: 10.0000 mg | ORAL_CAPSULE | Freq: Every day | ORAL | 3 refills | Status: AC

## 2023-02-23 ENCOUNTER — Other Ambulatory Visit (HOSPITAL_COMMUNITY): Payer: Self-pay

## 2023-02-23 ENCOUNTER — Other Ambulatory Visit: Payer: Self-pay

## 2023-02-24 ENCOUNTER — Other Ambulatory Visit (HOSPITAL_COMMUNITY): Payer: Self-pay

## 2023-02-27 ENCOUNTER — Other Ambulatory Visit (HOSPITAL_COMMUNITY): Payer: Self-pay

## 2023-02-28 ENCOUNTER — Ambulatory Visit
Admission: EM | Admit: 2023-02-28 | Discharge: 2023-02-28 | Disposition: A | Discharge status: Home / Self Care | Payer: Self-pay | Attending: Family Medicine | Admitting: Family Medicine

## 2023-02-28 DIAGNOSIS — J069 Acute upper respiratory infection, unspecified: Secondary | ICD-10-CM

## 2023-02-28 LAB — POC COVID19/FLU A&B COMBO
Covid Antigen, POC: NEGATIVE
Influenza A Antigen, POC: NEGATIVE
Influenza B Antigen, POC: NEGATIVE

## 2023-02-28 MED ORDER — PROMETHAZINE-DM 6.25-15 MG/5ML PO SYRP: 5.0000 mL | ORAL_SOLUTION | Freq: Four times a day (QID) | ORAL | 0 refills | Status: AC | PRN

## 2023-02-28 NOTE — ED Triage Notes (Signed)
Pt reports he has a cough, congestion, and headaches, and sore throat x 2 days
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
## 2023-03-16 ENCOUNTER — Other Ambulatory Visit (HOSPITAL_COMMUNITY): Payer: Self-pay

## 2023-04-11 DIAGNOSIS — D123 Benign neoplasm of transverse colon: Secondary | ICD-10-CM | POA: Diagnosis not present

## 2023-04-11 DIAGNOSIS — Z860100 Personal history of colon polyps, unspecified: Secondary | ICD-10-CM | POA: Diagnosis not present

## 2023-04-11 DIAGNOSIS — D122 Benign neoplasm of ascending colon: Secondary | ICD-10-CM | POA: Diagnosis not present

## 2023-04-11 DIAGNOSIS — K635 Polyp of colon: Secondary | ICD-10-CM | POA: Diagnosis not present

## 2023-04-11 DIAGNOSIS — Z860101 Personal history of adenomatous and serrated colon polyps: Secondary | ICD-10-CM | POA: Diagnosis not present

## 2023-04-20 DIAGNOSIS — C61 Malignant neoplasm of prostate: Secondary | ICD-10-CM | POA: Diagnosis not present

## 2023-04-22 ENCOUNTER — Other Ambulatory Visit (HOSPITAL_COMMUNITY): Payer: Self-pay

## 2023-04-24 ENCOUNTER — Other Ambulatory Visit (HOSPITAL_COMMUNITY): Payer: Self-pay

## 2023-04-26 DIAGNOSIS — N401 Enlarged prostate with lower urinary tract symptoms: Secondary | ICD-10-CM | POA: Diagnosis not present

## 2023-04-26 DIAGNOSIS — K219 Gastro-esophageal reflux disease without esophagitis: Secondary | ICD-10-CM | POA: Diagnosis not present

## 2023-04-26 DIAGNOSIS — E1159 Type 2 diabetes mellitus with other circulatory complications: Secondary | ICD-10-CM | POA: Diagnosis not present

## 2023-04-26 DIAGNOSIS — I10 Essential (primary) hypertension: Secondary | ICD-10-CM | POA: Diagnosis not present

## 2023-04-26 DIAGNOSIS — E785 Hyperlipidemia, unspecified: Secondary | ICD-10-CM | POA: Diagnosis not present

## 2023-04-26 DIAGNOSIS — I7121 Aneurysm of the ascending aorta, without rupture: Secondary | ICD-10-CM | POA: Diagnosis not present

## 2023-04-27 DIAGNOSIS — C61 Malignant neoplasm of prostate: Secondary | ICD-10-CM | POA: Diagnosis not present

## 2023-04-27 DIAGNOSIS — R3915 Urgency of urination: Secondary | ICD-10-CM | POA: Diagnosis not present

## 2023-04-27 DIAGNOSIS — N401 Enlarged prostate with lower urinary tract symptoms: Secondary | ICD-10-CM | POA: Diagnosis not present

## 2023-05-01 ENCOUNTER — Other Ambulatory Visit: Payer: Self-pay | Admitting: Family Medicine

## 2023-05-01 DIAGNOSIS — I7121 Aneurysm of the ascending aorta, without rupture: Secondary | ICD-10-CM

## 2023-05-08 ENCOUNTER — Ambulatory Visit
Admission: RE | Admit: 2023-05-08 | Discharge: 2023-05-08 | Disposition: A | Discharge status: Home / Self Care | Payer: Self-pay | Source: Ambulatory Visit | Attending: Family Medicine | Admitting: Family Medicine

## 2023-05-08 DIAGNOSIS — R918 Other nonspecific abnormal finding of lung field: Secondary | ICD-10-CM | POA: Diagnosis not present

## 2023-05-08 DIAGNOSIS — I77810 Thoracic aortic ectasia: Secondary | ICD-10-CM | POA: Diagnosis not present

## 2023-05-08 DIAGNOSIS — N62 Hypertrophy of breast: Secondary | ICD-10-CM | POA: Diagnosis not present

## 2023-05-08 DIAGNOSIS — I7121 Aneurysm of the ascending aorta, without rupture: Secondary | ICD-10-CM

## 2023-05-08 MED ORDER — IOPAMIDOL (ISOVUE-370) INJECTION 76%
75.0000 mL | Freq: Once | INTRAVENOUS | Status: AC | PRN
  Administered 2023-05-08: 75 mL via INTRAVENOUS

## 2023-05-29 ENCOUNTER — Other Ambulatory Visit (HOSPITAL_COMMUNITY): Payer: Self-pay

## 2023-06-30 ENCOUNTER — Other Ambulatory Visit: Payer: Self-pay | Admitting: Nurse Practitioner

## 2023-06-30 ENCOUNTER — Ambulatory Visit
Admission: RE | Admit: 2023-06-30 | Discharge: 2023-06-30 | Disposition: A | Discharge status: Home / Self Care | Payer: Worker's Compensation | Source: Ambulatory Visit | Attending: Nurse Practitioner | Admitting: Nurse Practitioner

## 2023-06-30 DIAGNOSIS — M25512 Pain in left shoulder: Secondary | ICD-10-CM

## 2023-07-10 ENCOUNTER — Other Ambulatory Visit (HOSPITAL_COMMUNITY): Payer: Self-pay

## 2023-08-07 ENCOUNTER — Other Ambulatory Visit (HOSPITAL_COMMUNITY): Payer: Self-pay

## 2023-08-07 DIAGNOSIS — E785 Hyperlipidemia, unspecified: Secondary | ICD-10-CM | POA: Diagnosis not present

## 2023-08-07 DIAGNOSIS — E1165 Type 2 diabetes mellitus with hyperglycemia: Secondary | ICD-10-CM | POA: Diagnosis not present

## 2023-08-07 DIAGNOSIS — I10 Essential (primary) hypertension: Secondary | ICD-10-CM | POA: Diagnosis not present

## 2023-08-07 DIAGNOSIS — C61 Malignant neoplasm of prostate: Secondary | ICD-10-CM | POA: Diagnosis not present

## 2023-08-21 ENCOUNTER — Ambulatory Visit
Admission: RE | Admit: 2023-08-21 | Discharge: 2023-08-21 | Disposition: A | Discharge status: Home / Self Care | Payer: Worker's Compensation | Source: Ambulatory Visit | Attending: Nurse Practitioner

## 2023-08-21 ENCOUNTER — Other Ambulatory Visit: Payer: Self-pay | Admitting: Nurse Practitioner

## 2023-08-21 DIAGNOSIS — R52 Pain, unspecified: Secondary | ICD-10-CM

## 2023-09-05 ENCOUNTER — Other Ambulatory Visit (HOSPITAL_COMMUNITY): Payer: Self-pay

## 2023-09-19 ENCOUNTER — Ambulatory Visit: Payer: Self-pay | Admitting: Orthopedic Surgery

## 2023-09-21 ENCOUNTER — Ambulatory Visit (INDEPENDENT_AMBULATORY_CARE_PROVIDER_SITE_OTHER): Payer: Worker's Compensation | Admitting: Orthopaedic Surgery

## 2023-09-21 ENCOUNTER — Encounter: Payer: Self-pay | Admitting: Orthopaedic Surgery

## 2023-09-21 VITALS — BP 147/84 | HR 69 | Ht 72.0 in | Wt 230.0 lb

## 2023-09-21 DIAGNOSIS — M542 Cervicalgia: Secondary | ICD-10-CM

## 2023-09-21 DIAGNOSIS — M25512 Pain in left shoulder: Secondary | ICD-10-CM

## 2023-09-21 DIAGNOSIS — G8929 Other chronic pain: Secondary | ICD-10-CM | POA: Diagnosis not present

## 2023-09-21 MED ORDER — CYCLOBENZAPRINE HCL 10 MG PO TABS: 10.0000 mg | ORAL_TABLET | Freq: Every day | ORAL | 0 refills | Status: AC

## 2023-09-21 MED ORDER — NAPROXEN 500 MG PO TABS: 500.0000 mg | ORAL_TABLET | Freq: Two times a day (BID) | ORAL | 5 refills | Status: AC
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
## 2023-09-28 ENCOUNTER — Telehealth: Payer: Self-pay | Admitting: Orthopaedic Surgery

## 2023-09-28 NOTE — Telephone Encounter (Signed)
 Employer is asking for a work status patient  is currently working, and light duty is available

## 2023-09-28 NOTE — Telephone Encounter (Signed)
 Patient is WC/ [place] asked me to ask Dr Brenna about work [employer] is asking for a work status  Is he able to work   Franklin Hall will find out his current work status, if he is able to work,and if light duty is available

## 2023-10-02 ENCOUNTER — Encounter: Payer: Self-pay | Admitting: Orthopaedic Surgery

## 2023-10-02 NOTE — Telephone Encounter (Signed)
 Thank you :)

## 2023-10-04 ENCOUNTER — Encounter: Payer: Self-pay | Admitting: Orthopaedic Surgery

## 2023-10-04 ENCOUNTER — Ambulatory Visit (INDEPENDENT_AMBULATORY_CARE_PROVIDER_SITE_OTHER): Payer: Worker's Compensation | Admitting: Orthopaedic Surgery

## 2023-10-04 VITALS — BP 158/94 | HR 66 | Ht 72.0 in | Wt 230.0 lb

## 2023-10-04 DIAGNOSIS — R202 Paresthesia of skin: Secondary | ICD-10-CM | POA: Diagnosis not present

## 2023-10-04 DIAGNOSIS — M542 Cervicalgia: Secondary | ICD-10-CM | POA: Diagnosis not present

## 2023-10-04 DIAGNOSIS — M25512 Pain in left shoulder: Secondary | ICD-10-CM | POA: Diagnosis not present

## 2023-10-04 DIAGNOSIS — G8929 Other chronic pain: Secondary | ICD-10-CM

## 2023-10-04 NOTE — Progress Notes (Signed)
 I still have numbness.  His left shoulder is a little better after the injection last time but he continues to have numbness to the left thumb, index and long fingers that seems to shoot down there at times from the neck and shoulder area.    He is working light duty, no more than ten pounds lifting on the left, which I will continue.  He has no new trauma.  ROM of the left shoulder is improved today.  Forward 110, aduction 90, internal 20, external 15, extension 10, adduction full.  NV intact.  ROM of neck is tender more to the left.  He has decreased sensation of the left thumb and index fingers.  Grip weaker on the left.  Encounter Diagnoses  Name Primary?   Chronic pain in left shoulder Yes   Neck pain on left side    I feel he needs a MRI of the cervical spine.  I am awaiting approval by [employer].  Return in two weeks.  Continue the light duty work.  Call if any problem.  Precautions discussed.  Electronically Signed Lemond Stable, MD 8/27/20252:59 PM

## 2023-10-04 NOTE — Patient Instructions (Signed)
 Work note to continue light duty, no lifting over 10lbs   Return in 2 weeks

## 2023-10-08 DIAGNOSIS — E785 Hyperlipidemia, unspecified: Secondary | ICD-10-CM | POA: Diagnosis not present

## 2023-10-08 DIAGNOSIS — I10 Essential (primary) hypertension: Secondary | ICD-10-CM | POA: Diagnosis not present

## 2023-10-08 DIAGNOSIS — E1165 Type 2 diabetes mellitus with hyperglycemia: Secondary | ICD-10-CM | POA: Diagnosis not present

## 2023-10-08 DIAGNOSIS — C61 Malignant neoplasm of prostate: Secondary | ICD-10-CM | POA: Diagnosis not present

## 2023-10-16 ENCOUNTER — Other Ambulatory Visit (HOSPITAL_COMMUNITY): Payer: Self-pay

## 2023-10-17 DIAGNOSIS — E785 Hyperlipidemia, unspecified: Secondary | ICD-10-CM | POA: Diagnosis not present

## 2023-10-17 DIAGNOSIS — C61 Malignant neoplasm of prostate: Secondary | ICD-10-CM | POA: Diagnosis not present

## 2023-10-17 DIAGNOSIS — Z6830 Body mass index (BMI) 30.0-30.9, adult: Secondary | ICD-10-CM | POA: Diagnosis not present

## 2023-10-17 DIAGNOSIS — E1165 Type 2 diabetes mellitus with hyperglycemia: Secondary | ICD-10-CM | POA: Diagnosis not present

## 2023-10-17 DIAGNOSIS — N401 Enlarged prostate with lower urinary tract symptoms: Secondary | ICD-10-CM | POA: Diagnosis not present

## 2023-10-17 DIAGNOSIS — K219 Gastro-esophageal reflux disease without esophagitis: Secondary | ICD-10-CM | POA: Diagnosis not present

## 2023-10-17 DIAGNOSIS — I10 Essential (primary) hypertension: Secondary | ICD-10-CM | POA: Diagnosis not present

## 2023-10-25 DIAGNOSIS — S62651B Nondisplaced fracture of medial phalanx of left index finger, initial encounter for open fracture: Secondary | ICD-10-CM | POA: Diagnosis not present

## 2023-10-25 DIAGNOSIS — M79645 Pain in left finger(s): Secondary | ICD-10-CM | POA: Insufficient documentation

## 2023-11-01 ENCOUNTER — Ambulatory Visit: Payer: Self-pay | Admitting: Orthopaedic Surgery

## 2023-11-01 DIAGNOSIS — I10 Essential (primary) hypertension: Secondary | ICD-10-CM | POA: Insufficient documentation

## 2023-11-07 DIAGNOSIS — I10 Essential (primary) hypertension: Secondary | ICD-10-CM | POA: Diagnosis not present

## 2023-11-07 DIAGNOSIS — E1165 Type 2 diabetes mellitus with hyperglycemia: Secondary | ICD-10-CM | POA: Diagnosis not present

## 2023-11-07 DIAGNOSIS — E785 Hyperlipidemia, unspecified: Secondary | ICD-10-CM | POA: Diagnosis not present

## 2023-11-07 DIAGNOSIS — C61 Malignant neoplasm of prostate: Secondary | ICD-10-CM | POA: Diagnosis not present

## 2023-11-16 ENCOUNTER — Other Ambulatory Visit (HOSPITAL_COMMUNITY): Payer: Self-pay

## 2023-11-20 ENCOUNTER — Other Ambulatory Visit (HOSPITAL_COMMUNITY): Payer: Self-pay

## 2023-11-20 ENCOUNTER — Other Ambulatory Visit: Payer: Self-pay

## 2023-11-28 DIAGNOSIS — C61 Malignant neoplasm of prostate: Secondary | ICD-10-CM | POA: Diagnosis not present

## 2023-12-05 DIAGNOSIS — R399 Unspecified symptoms and signs involving the genitourinary system: Secondary | ICD-10-CM | POA: Diagnosis not present

## 2023-12-05 DIAGNOSIS — N401 Enlarged prostate with lower urinary tract symptoms: Secondary | ICD-10-CM | POA: Diagnosis not present

## 2023-12-05 DIAGNOSIS — N5201 Erectile dysfunction due to arterial insufficiency: Secondary | ICD-10-CM | POA: Diagnosis not present

## 2023-12-05 DIAGNOSIS — R3912 Poor urinary stream: Secondary | ICD-10-CM | POA: Diagnosis not present

## 2023-12-05 DIAGNOSIS — R3914 Feeling of incomplete bladder emptying: Secondary | ICD-10-CM | POA: Diagnosis not present

## 2023-12-05 DIAGNOSIS — R350 Frequency of micturition: Secondary | ICD-10-CM | POA: Diagnosis not present

## 2023-12-05 DIAGNOSIS — N400 Enlarged prostate without lower urinary tract symptoms: Secondary | ICD-10-CM | POA: Diagnosis not present

## 2023-12-05 DIAGNOSIS — R351 Nocturia: Secondary | ICD-10-CM | POA: Diagnosis not present

## 2023-12-05 DIAGNOSIS — C61 Malignant neoplasm of prostate: Secondary | ICD-10-CM | POA: Diagnosis not present

## 2023-12-07 ENCOUNTER — Other Ambulatory Visit (HOSPITAL_COMMUNITY): Payer: Self-pay

## 2023-12-27 ENCOUNTER — Other Ambulatory Visit (INDEPENDENT_AMBULATORY_CARE_PROVIDER_SITE_OTHER): Payer: Worker's Compensation

## 2023-12-27 ENCOUNTER — Ambulatory Visit (INDEPENDENT_AMBULATORY_CARE_PROVIDER_SITE_OTHER): Payer: Worker's Compensation | Admitting: Orthopedic Surgery

## 2023-12-27 VITALS — BP 166/97 | HR 68 | Ht 72.0 in | Wt 230.0 lb

## 2023-12-27 DIAGNOSIS — M25512 Pain in left shoulder: Secondary | ICD-10-CM

## 2023-12-27 DIAGNOSIS — G8929 Other chronic pain: Secondary | ICD-10-CM

## 2023-12-27 DIAGNOSIS — M542 Cervicalgia: Secondary | ICD-10-CM

## 2023-12-27 DIAGNOSIS — M5412 Radiculopathy, cervical region: Secondary | ICD-10-CM
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
## 2024-02-07 ENCOUNTER — Ambulatory Visit: Payer: Self-pay | Admitting: Orthopedic Surgery

## 2024-02-07 DIAGNOSIS — M5412 Radiculopathy, cervical region: Secondary | ICD-10-CM | POA: Diagnosis not present

## 2024-02-07 NOTE — Progress Notes (Signed)
 Orthopedic Spine Surgery Office Note   Assessment: Patient is a 78 y.o. male with neck pain that radiates into his left arm, forearm, and into the thumb, suspect radiculopathy     Plan: -Patient has tried PT, Tylenol , gabapentin  -Patient has now done PT and tylenol  for the last six weeks without any significant improvement in his radiating arm pain, so recommended a MRI of the cervical spine to evaluate for radiculopathy -Patient should return to office in 4-5 weeks, x-rays at next visit: none     Patient expressed understanding of the plan and all questions were answered to the patient's satisfaction.    ___________________________________________________________________________     History:   Patient is a 78 y.o. male who presents today for follow up on his cervical spine. Patient continues to notice neck pain that radiates into his left upper extremity. He feels it going into the left lateral arm, left dorsal forearm, and into the left hand (particularly the left thumb). He feels that his pain is maybe 10-15% better since going to PT after our last visit. He has not developed any new symptoms since he was last in the office.    Treatments tried: PT, tylenol , gabapentin      Physical Exam:   General: no acute distress, appears stated age Neurologic: alert, answering questions appropriately, following commands Respiratory: unlabored breathing on room air, symmetric chest rise Psychiatric: appropriate affect, normal cadence to speech     MSK (spine):   -Strength exam                                                   Left                  Right Grip strength                5/5                  5/5 Interosseus                  5/5                  5/5 Wrist extension            5/5                  5/5 Wrist flexion                 5/5                  5/5 Elbow flexion                5/5                  4+/5 Deltoid                          5/5                  5/5    -Sensory exam                           Sensation intact to light touch in C5-T1 nerve distributions of bilateral upper extremities   Left shoulder exam: Pain with external rotation past  80 degrees, no pain with internal rotation, pain but no weakness with Jobe, no weakness with external rotation with arm at side, negative belly press   Imaging: XRs of the cervical spine from 12/27/2023 were previously independently reviewed and interpreted, showing disc height loss at C5/6 and C6/7. No evidence of instability on flexion/extension views. No fracture or dislocation seen.     Patient name: Franklin Hall Patient MRN: [id number] Date of visit: 02/07/2024

## 2024-02-13 ENCOUNTER — Ambulatory Visit (HOSPITAL_COMMUNITY): Payer: Self-pay
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
## 2024-02-14 ENCOUNTER — Ambulatory Visit (HOSPITAL_COMMUNITY): Payer: Self-pay

## 2024-03-07 ENCOUNTER — Other Ambulatory Visit (HOSPITAL_COMMUNITY): Payer: Self-pay

## 2024-04-18 ENCOUNTER — Ambulatory Visit: Payer: Self-pay | Admitting: Orthopedic Surgery
